# Patient Record
Sex: Female | Born: 1970 | Race: White | Hispanic: Yes | Marital: Single | Smoking: Never smoker
Health system: Southern US, Community
[De-identification: ages and names within clinical notes are randomized; demographics above are authoritative.]

## PROBLEM LIST (undated history)

## (undated) DIAGNOSIS — A159 Respiratory tuberculosis unspecified: Secondary | ICD-10-CM

## (undated) HISTORY — DX: Respiratory tuberculosis unspecified: A15.9

---

## 2021-06-11 ENCOUNTER — Ambulatory Visit (INDEPENDENT_AMBULATORY_CARE_PROVIDER_SITE_OTHER): Payer: Self-pay

## 2021-06-11 ENCOUNTER — Ambulatory Visit
Admission: EM | Admit: 2021-06-11 | Discharge: 2021-06-11 | Disposition: A | Discharge status: Home / Self Care | Payer: Self-pay | Attending: Family Medicine | Admitting: Family Medicine

## 2021-06-11 ENCOUNTER — Other Ambulatory Visit: Payer: Self-pay

## 2021-06-11 DIAGNOSIS — A150 Tuberculosis of lung: Secondary | ICD-10-CM

## 2021-06-11 DIAGNOSIS — R1110 Vomiting, unspecified: Secondary | ICD-10-CM

## 2021-06-11 DIAGNOSIS — R0989 Other specified symptoms and signs involving the circulatory and respiratory systems: Secondary | ICD-10-CM

## 2021-06-11 MED ORDER — ONDANSETRON 4 MG PO TBDP
4.0000 mg | ORAL_TABLET | Freq: Three times a day (TID) | ORAL | 0 refills | Status: AC | PRN
Start: 2021-06-11 — End: ?

## 2021-06-11 NOTE — Discharge Instructions (Signed)
The health department will call for further monitoring and recommendations.  Continue your treatment.  Medication as directed for nausea and vomiting.  Take care  Dr. Adriana Simas

## 2021-06-11 NOTE — ED Provider Notes (Signed)
[facility]    CSN: [id number] Arrival date & time: 06/11/21  1054      History   Chief Complaint Vomiting  HPI  50 year old female presents with the above complaint.  Spanish interpreter used.  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  She is currently on treatment.  She was diagnosed and placed on treatment at the [place] [facility] and [place].  Medications are listed below.  Patient states that she was diagnosed 15 days ago.  The prescription bottles reflect that it was ordered on 8/30 and it was filled on 9/6.  Patient states that she quarantined for 2 weeks.  I am unsure if this is accurate.  She has no records with her other than the fact that she has her prescription medications.  Patient states that she has been having vomiting.  She states that this occurred when she was diagnosed with TB.  She states that she has not thrown up since yesterday.  No reports of cough.  No other respiratory symptoms.  No fever.  Patient is concerned about her diagnosis.  Upon initial triage, she was requesting evaluation of her lungs (referring to x-ray).  She is currently residing with her daughter here in [place].  Home Medications    Prior to Admission medications   Medication Sig Start Date End Date Taking? Authorizing Provider  ethambutol (MYAMBUTOL) 400 MG tablet Take by mouth daily.   Yes [provider]  famotidine (PEPCID) 20 MG tablet Take 20 mg by mouth 2 (two) times daily.   Yes [provider]  isoniazid (NYDRAZID) 300 MG tablet Take by mouth daily.   Yes [provider]  ondansetron (ZOFRAN ODT) 4 MG disintegrating tablet Take 1 tablet (4 mg total) by mouth every 8 (eight) hours as needed for nausea or vomiting. 06/11/21  Yes Everlene Other G, DO  pyrazinamide 500 MG tablet Take by mouth daily.   Yes [provider]  pyridOXINE (B-6) 50 MG tablet Take 50 mg by mouth daily.   Yes [provider]   rifampin (RIFADIN) 300 MG capsule Take by mouth.   Yes [provider]    Family History No family history on file.  Social History     Allergies   Patient has no allergy information on record.   Review of Systems Review of Systems Per HPI  Physical Exam Triage Vital Signs ED Triage Vitals  Enc Vitals Group     BP 06/11/21 1152 125/66     Pulse Rate 06/11/21 1152 89     Resp 06/11/21 1152 16     Temp 06/11/21 1152 98.5 F (36.9 C)     Temp Source 06/11/21 1152 Oral     SpO2 06/11/21 1152 100 %     Weight --      Height --      Head Circumference --      Peak Flow --      Pain Score 06/11/21 1149 0     Pain Loc --      Pain Edu? --      Excl. in GC? --    Updated Vital Signs BP 125/66 (BP Location: Left Arm)   Pulse 89   Temp 98.5 F (36.9 C) (Oral)   Resp 16   SpO2 100%   Visual Acuity Right Eye Distance:   Left Eye Distance:   Bilateral Distance:    Right Eye Near:   Left Eye Near:  Bilateral Near:     Physical Exam Vitals and nursing note reviewed.  Constitutional:      General: She is not in acute distress.    Appearance: Normal appearance. She is not ill-appearing.  HENT:     Head: Normocephalic and atraumatic.  Eyes:     General:        Right eye: No discharge.        Left eye: No discharge.     Conjunctiva/sclera: Conjunctivae normal.  Cardiovascular:     Rate and Rhythm: Normal rate and regular rhythm.  Pulmonary:     Comments: Crackles and coarse breath sounds in the upper lobes. Abdominal:     General: There is no distension.     Palpations: Abdomen is soft.     Tenderness: There is no abdominal tenderness.  Neurological:     Mental Status: She is alert.  Psychiatric:        Mood and Affect: Mood normal.        Behavior: Behavior normal.     UC Treatments / Results  Labs (all labs ordered are listed, but only abnormal results are displayed) Labs Reviewed - No data to display  EKG   Radiology DG Chest 2  View  Result Date: 06/11/2021 CLINICAL DATA:  Abnormal lung sounds. On treatment for tuberculosis. EXAM: CHEST - 2 VIEW COMPARISON:  None. FINDINGS: Two views of the chest demonstrate architectural distortion in the upper lungs, right side greater than left. Evidence for cystic changes or bronchiectasis in the upper lungs. Pleural-based densities along the lung apices, right side greater than left. Questionable 2.0 cm round nodular density near the right lung apex. Heart size is normal. No large pleural effusions. IMPRESSION: Architectural distortion in the upper lungs, right side greater than left. Scattered pleural and parenchymal opacities in the upper lungs that could represent acute on chronic disease, right side greater than left. In addition, there is an indeterminate 2.0 cm nodular opacity in the right upper lung. Recommend continued follow-up or further characterization with chest CT. Electronically Signed   By: Richarda Overlie M.D.   On: 06/11/2021 12:30    Procedures Procedures (including critical care time)  Medications Ordered in UC Medications - No data to display  Initial Impression / Assessment and Plan / UC Course  I have reviewed the triage vital signs and the nursing notes.  Pertinent labs & imaging results that were available during my care of the patient were reviewed by me and considered in my medical decision making (see chart for details).    50 year old female presents with vomiting.  Patient is on treatment for tuberculosis.  X-ray was obtained.  X-ray shows extensive changes and opacities in the upper lobes.  I have contacted the health department regarding her case.  They will be in touch with the patient.  Zofran as needed.  Final Clinical Impressions(s) / UC Diagnoses   Final diagnoses:  Non-intractable vomiting, presence of nausea not specified, unspecified vomiting type  TB (pulmonary tuberculosis)     Discharge Instructions      The health department will call  for further monitoring and recommendations.  Continue your treatment.  Medication as directed for nausea and vomiting.  Take care  Dr. Adriana Simas      ED Prescriptions     Medication Sig Dispense Auth. Provider   ondansetron (ZOFRAN ODT) 4 MG disintegrating tablet Take 1 tablet (4 mg total) by mouth every 8 (eight) hours as needed for nausea or vomiting. 20  tablet Tommie Sams, DO      PDMP not reviewed this encounter.   Tommie Sams, [place] 06/11/21 1325

## 2021-06-11 NOTE — ED Triage Notes (Signed)
Pt states she is here to have her lungs looked at. Pt was DX with TB 15 days ago.

## 2021-06-24 ENCOUNTER — Other Ambulatory Visit: Payer: Self-pay

## 2021-06-24 ENCOUNTER — Ambulatory Visit (LOCAL_COMMUNITY_HEALTH_CENTER): Payer: Self-pay

## 2021-06-24 VITALS — Wt 94.0 lb

## 2021-06-24 DIAGNOSIS — Z0389 Encounter for observation for other suspected diseases and conditions ruled out: Secondary | ICD-10-CM

## 2021-06-24 LAB — HM HIV SCREENING LAB: HM HIV Screening: NEGATIVE

## 2021-06-25 LAB — HGB A1C W/O EAG: Hgb A1c MFr Bld: 5.9 % — ABNORMAL HIGH (ref 4.8–5.6)

## 2021-06-26 LAB — CREATININE, SERUM
Creatinine, Ser: 0.44 mg/dL — ABNORMAL LOW (ref 0.57–1.00)
eGFR: 117 mL/min/{1.73_m2} (ref >59)

## 2021-06-27 LAB — CBC WITH DIFFERENTIAL/PLATELET
Basophils Absolute: 0.1 10*3/uL (ref 0.0–0.2)
Basos: 1 %
EOS (ABSOLUTE): 0.4 10*3/uL (ref 0.0–0.4)
Eos: 5 %
Hematocrit: 39.8 % (ref 34.0–46.6)
Hemoglobin: 12.7 g/dL (ref 11.1–15.9)
Immature Grans (Abs): 0.1 10*3/uL (ref 0.0–0.1)
Immature Granulocytes: 1 %
Lymphocytes Absolute: 1.5 10*3/uL (ref 0.7–3.1)
Lymphs: 21 %
MCH: 26.2 pg — ABNORMAL LOW (ref 26.6–33.0)
MCHC: 31.9 g/dL (ref 31.5–35.7)
MCV: 82 fL (ref 79–97)
Monocytes Absolute: 0.3 10*3/uL (ref 0.1–0.9)
Monocytes: 4 %
Neutrophils Absolute: 5 10*3/uL (ref 1.4–7.0)
Neutrophils: 68 %
Platelets: 664 10*3/uL — ABNORMAL HIGH (ref 150–450)
RBC: 4.84 x10E6/uL (ref 3.77–5.28)
RDW: 16.9 % — ABNORMAL HIGH (ref 11.7–15.4)
WBC: 7.3 10*3/uL (ref 3.4–10.8)

## 2021-06-27 LAB — HEPATIC FUNCTION PANEL
ALT: 96 IU/L — ABNORMAL HIGH (ref 0–32)
AST: 58 IU/L — ABNORMAL HIGH (ref 0–40)
Albumin: 3.5 g/dL — ABNORMAL LOW (ref 3.8–4.9)
Alkaline Phosphatase: 90 IU/L (ref 44–121)
Bilirubin Total: 0.5 mg/dL (ref 0.0–1.2)
Bilirubin, Direct: 0.29 mg/dL (ref 0.00–0.40)
Total Protein: 6.7 g/dL (ref 6.0–8.5)

## 2021-06-27 LAB — QUANTIFERON-TB GOLD PLUS
QuantiFERON Mitogen Value: 0.27 IU/mL
QuantiFERON Nil Value: 0.18 IU/mL
QuantiFERON TB1 Ag Value: 0.54 IU/mL
QuantiFERON TB2 Ag Value: 0.57 IU/mL
QuantiFERON-TB Gold Plus: POSITIVE — AB

## 2021-07-11 ENCOUNTER — Encounter (HOSPITAL_COMMUNITY): Payer: Self-pay | Admitting: Radiology

## 2021-07-11 NOTE — Progress Notes (Signed)
49yo patient that is being evaluated for TB. She fled [place] and entered [place] in August.  ICE detained her and she was placed in a detention center where they did a CXR and sputums.  She states she has vomited everyday since starting RIPE, the end of August and was tearful in the office today. Patient states the medicines are making me sick. IJN forms received but hard to read; included some labs and negative sputum smear. Patient states she had a cough when she arrived in [place] but once she started inhalers the cough subsided.  C/o stomach burning from TBMs.  Has been told previously that she is prediabetes.   [facility] from [place] finally sent culture results today 07/11/21 and she is positive for MTB. We stopped her meds when she was in on 06/24/21 d/t the vomiting.  She denies any sx's of TB and cannot produce sputum.    Her last CXR was 06/11/21 when she was evaluated an urgent care. Richmond Campbell, RN

## 2021-07-12 ENCOUNTER — Other Ambulatory Visit: Payer: Self-pay | Admitting: Family Medicine

## 2021-07-12 ENCOUNTER — Other Ambulatory Visit: Payer: Self-pay

## 2021-07-12 ENCOUNTER — Ambulatory Visit: Payer: Self-pay

## 2021-07-12 VITALS — Wt 94.0 lb

## 2021-07-12 DIAGNOSIS — A159 Respiratory tuberculosis unspecified: Secondary | ICD-10-CM | POA: Insufficient documentation

## 2021-07-12 NOTE — Progress Notes (Signed)
Tuberculosis treatment orders  All patients are to be monitored per [place] and county TB policies.    Kathleen Wade has active/suspect TB. Treat for active TB per the following:  See EPI for patient hx.  Collect sputum when patient able to provide sample.     CBC with platelets, liver function tests, creatinine, and HIV completed on 06/24/21 Draw liver function tests monthly. Order end of treatment chest x-ray. Video DOT approved for this patient.  Serial rechallenge: Week #1: Dose #1 EMB (full dose), RIF 600 mg Dose #2 EMB, RIF 600 mg Dose #3 EMB, RIF 600 mg. Dose #4 EMB, RIF 600 mg Dose #5 EMB, RIF 600 mg  Week #2: Dose #6 EMB, RIF 600 mg, INH 300 mg Dose #7 EMB, RIF 600 mg, INH 300 mg Dose #8 EMB, RIF 600 mg, INH 300 mg Dose #9 EMB, RIF 600 mg, INH 300 mg Dose #10 EMB, RIF 600 mg, INH 300 mg  Week #3: Dose #11 EMB, RIF 600 mg, INH 300 mg, PZA 500 mg Dose #12 EMB, RIF 600 mg, INH 300 mg, PZA 1000mg  Dose #13 EMB, RIF 600 mg, INH 300mg , PZA 1500 mg Dose #14 EMB, RIF 600 mg, INH 300mg , PZA full dose Dose #15 EMB, RIF 600 mg, INH 300mg , PZA full dose   Due to patient vomiting daily with previous RIPE treatment started in , will do a serial rechallenge with TB meds. If patient tolerates well, will transition to traditional tx plan as follows.  For eight weeks:  Isoniazid 300mg  daily by mouth. Rifampin 600mg  daily by mouth. Pyrazinamide 1000mg  daily by mouth. Ethambutol 800mg  daily by mouth. Vitamin B6 25mg  daily by mouth.  THEN for the next 18 weeks:   Isoniazid 900mg  by mouth three times per week. Rifampin 600mg  by mouth three times per week. Vitamin B6 50mg  by mouth three times per week.  Comments:      Discontinue PZA after 8 weeks if organism is fully susceptible to INH and Rifampin.  Discontinue EMB when drug susceptibility testing on the initial positive culture indicates that the organism is fully suscpeptible to INH and Rifampin.   If the patient  has a cavity on initial X-ray and fails to convert two sputum specimens to negative within 60 days of starting treatment (based on the collection date), treatment must be extended for a total of nine months (continuation phase of 31 weeks)

## 2021-07-12 NOTE — Progress Notes (Addendum)
Patient in clinic today after we received +MTB results from [place] yesterday.  Will start TB meds today and do a serial rechallenge per Dr. Valentina Lucks, recommendation, because the patient vomited daily with TB meds previously. In with daughter today.  States would like to do DOT daily ~2pm and will use her TB med supply from [place] and then transition to [facility] pharmacy supply.  Patient c/o breast pain but not sure if its from TB or breast. Has a new pt appt with [facility] on 08/01/21; encourage patient and daughter to inform PCP of pain.  [facility] will arrange new pt exam with Dr. Alvester Morin in the coming weeks.   Patient will RTC on 07/17/21 for next phase of med regimen and then RTC once again on 07/22/21. Patient given TB RNs contact #s for weekends and evenings if needed.   Patient denies TBM side effects.  TBM administered via DOT per Dr. Karyl Kinnier orders. Patient tolerated well.  Observed patient taking EMB 800mg  and Rifampin 600mg . Advised patient to call [facility] to report any signs and symptoms ASAP and to hold TB medication if symptoms occur.  No sputums or labs drawn today. , RN    Interpreter V. Olmedo

## 2021-07-15 ENCOUNTER — Ambulatory Visit: Payer: Self-pay

## 2021-07-15 DIAGNOSIS — A159 Respiratory tuberculosis unspecified: Secondary | ICD-10-CM

## 2021-07-15 NOTE — Progress Notes (Signed)
Patient denies TBM side effects.  States medications did not bother her stomach this weekend, no nausea or vomiting.  TBM administered via DOT per Dr. Karyl Kinnier orders. Patient tolerated well.  Observed patient taking EMB 800mg  and Rifampin 600mg . Advised patient to call [facility] to report any signs and symptoms ASAP and to hold TB medication if symptoms occur.  No sputums or labs drawn today. , RN

## 2021-07-16 ENCOUNTER — Ambulatory Visit: Payer: Self-pay

## 2021-07-16 DIAGNOSIS — A159 Respiratory tuberculosis unspecified: Secondary | ICD-10-CM

## 2021-07-17 ENCOUNTER — Ambulatory Visit (LOCAL_COMMUNITY_HEALTH_CENTER): Payer: Self-pay

## 2021-07-17 VITALS — Wt 94.0 lb

## 2021-07-17 DIAGNOSIS — A159 Respiratory tuberculosis unspecified: Secondary | ICD-10-CM

## 2021-07-17 NOTE — Progress Notes (Signed)
Patient denies any new TBM side effects. States she continues with "a little burning" in the stomach, with a 1-2 on the pain scale.  States her pain was an 8 on the pain scale when she previously took the medications.  States she doesn't take her Famotidine (Pepcid) as prescribed, only takes one every once and a while.  Encouraged to take as prescribed.   Will begin INH today. No numbness/tingling in extremities only slight pain in fingers yesterday morning, 1 on the pain scale and resolved in a few hours.    Reviewed "reintroducing TB Meds DOT schedule" with patient and daughter.  Today is Week #2 and Dose #6 of Reintroducing Daily TB Medications.  Will continue with EMB 800mg /Rifampin 600mg  and will add INH 300mg  today as per Dr. order.   TBM administered via DOT per Dr. orders. Patient tolerated well.  Observed patient taking EMB 800mg , Rifampin 600mg  and INH 300mg .     LFT and CBC labs drawn today.  Patient will return for office visit - repeat labs and Week #3 medications Monday 07/22/2021 at 3:00pm.  Video DOT will continue at 2pm weekdays.  Self administer weekends.  Educated regarding side effects of TBM.   Advised patient to call [facility] to report any signs and symptoms ASAP and to hold TB medication if symptoms occur.   TB Treatment Agreement read by interpreter Karyl Kinnier, patient and daughter verbalizes understanding, patient signed agreement.  Copy given to patient.   , RN

## 2021-07-17 NOTE — Progress Notes (Signed)
Late Entry: Patient denies TBM side effects. TBM administered via DOT per Dr. Karyl Kinnier orders. Patient tolerated well.  Observed patient taking EMB 800mg  and Rifampin 600mg . Advised patient to call [facility] to report any signs and symptoms ASAP and to hold TB medication if symptoms occur.  No sputums or labs drawn today. , RN

## 2021-07-17 NOTE — Progress Notes (Deleted)
Patient denies TBM side effects. TBM administered via DOT per Dr. Karyl Kinnier orders. Patient tolerated well.  Observed patient taking EMB 800mg  and Rifampin 600mg . Advised patient to call [facility] to report any signs and symptoms ASAP and to hold TB medication if symptoms occur.  No sputums or labs drawn today. , RN

## 2021-07-18 ENCOUNTER — Ambulatory Visit: Payer: Self-pay

## 2021-07-18 DIAGNOSIS — A159 Respiratory tuberculosis unspecified: Secondary | ICD-10-CM

## 2021-07-18 LAB — CBC WITH DIFFERENTIAL/PLATELET
Basophils Absolute: 0.1 10*3/uL (ref 0.0–0.2)
Basos: 1 %
EOS (ABSOLUTE): 0.6 10*3/uL — ABNORMAL HIGH (ref 0.0–0.4)
Eos: 9 %
Hematocrit: 39.9 % (ref 34.0–46.6)
Hemoglobin: 13 g/dL (ref 11.1–15.9)
Immature Grans (Abs): 0 10*3/uL (ref 0.0–0.1)
Immature Granulocytes: 1 %
Lymphocytes Absolute: 1.8 10*3/uL (ref 0.7–3.1)
Lymphs: 28 %
MCH: 26.8 pg (ref 26.6–33.0)
MCHC: 32.6 g/dL (ref 31.5–35.7)
MCV: 82 fL (ref 79–97)
Monocytes Absolute: 0.4 10*3/uL (ref 0.1–0.9)
Monocytes: 5 %
Neutrophils Absolute: 3.6 10*3/uL (ref 1.4–7.0)
Neutrophils: 56 %
Platelets: 437 10*3/uL (ref 150–450)
RBC: 4.85 x10E6/uL (ref 3.77–5.28)
RDW: 17.5 % — ABNORMAL HIGH (ref 11.7–15.4)
WBC: 6.5 10*3/uL (ref 3.4–10.8)

## 2021-07-18 LAB — HEPATIC FUNCTION PANEL
ALT: 13 IU/L (ref 0–32)
AST: 29 IU/L (ref 0–40)
Albumin: 4.3 g/dL (ref 3.8–4.9)
Alkaline Phosphatase: 93 IU/L (ref 44–121)
Bilirubin Total: 0.3 mg/dL (ref 0.0–1.2)
Bilirubin, Direct: 0.15 mg/dL (ref 0.00–0.40)
Total Protein: 8 g/dL (ref 6.0–8.5)

## 2021-07-18 NOTE — Progress Notes (Signed)
Patient denies TBM side effects. States medications didn't hurt her stomach.  TBM administered via DOT per Dr. Karyl Kinnier orders. Patient tolerated well.  Observed patient taking EMB 800mg , Rifampin 600mg  and IHN 300mg . Advised patient to call [facility] to report any signs and symptoms ASAP and to hold TB medication if symptoms occur.  No sputums or labs drawn today. , RN

## 2021-07-19 ENCOUNTER — Ambulatory Visit: Payer: Self-pay

## 2021-07-19 DIAGNOSIS — A159 Respiratory tuberculosis unspecified: Secondary | ICD-10-CM

## 2021-07-19 NOTE — Progress Notes (Signed)
Patient denies TBM side effects. States medications didn't hurt her stomach.  TBM administered via DOT per Dr. Karyl Kinnier orders. Patient tolerated well.  Observed patient taking EMB 800mg , Rifampin 600mg  and IHN 300mg . Advised patient to call [facility] to report any signs and symptoms ASAP and to hold TB medication if symptoms occur.  No sputums or labs drawn today.  Patient to return to clinic Monday 10/24.  , RN

## 2021-07-22 ENCOUNTER — Ambulatory Visit (LOCAL_COMMUNITY_HEALTH_CENTER): Payer: Self-pay

## 2021-07-22 DIAGNOSIS — A159 Respiratory tuberculosis unspecified: Secondary | ICD-10-CM

## 2021-07-22 NOTE — Progress Notes (Signed)
Phone call from patient, Saturday 07/20/2021, states didn't take TB medications today due to vomiting after breakfast and abdominal pain.  Pain rates between 5-8.  No other symptoms at this time.  Per consult with Dr. Lyndel Safe, TB Medications held Saturday 07/20/2021 due to vomiting.  May restart EMB and RIF on Sunday 07/21/2021, will continue to hold INH.  Draw LFT's Monday.

## 2021-07-23 ENCOUNTER — Ambulatory Visit (LOCAL_COMMUNITY_HEALTH_CENTER): Payer: Self-pay

## 2021-07-23 ENCOUNTER — Other Ambulatory Visit: Payer: Self-pay

## 2021-07-23 VITALS — Wt 90.0 lb

## 2021-07-23 DIAGNOSIS — R17 Unspecified jaundice: Secondary | ICD-10-CM

## 2021-07-23 DIAGNOSIS — A159 Respiratory tuberculosis unspecified: Secondary | ICD-10-CM

## 2021-07-23 DIAGNOSIS — R1011 Right upper quadrant pain: Secondary | ICD-10-CM

## 2021-07-23 NOTE — Progress Notes (Signed)
Attestation of Medical Director for TB RN: I agree with the care provided to this patient and was available for any consultation.  I was consulted and documentation reflects my recommendations.  ° °Sybel Standish [place] Tashiana Lamarca, MD, MPH, ABFM °[facility] Medical Director ° °

## 2021-07-23 NOTE — Progress Notes (Signed)
Friday vomited all day, Saturday vomited all day,  Sunday vomited all day long and yesterday vomited in the AM x 1.  Still with abdominal pain. Denies diarrhea, fever, headache.  Last day she took TBMs were Saturday.  Patient's grandchildren have had vomiting and nasal congestion also throughout the weekend.

## 2021-07-23 NOTE — Progress Notes (Signed)
Medical Director EXAM for TB program  Subjective: here today for TB medication. Had nausea/vomitting starting 10/21. She restarted INH on 10/19. She also reported abdominal pain that started 10/22.   Objective: Physical Exam Nursing note reviewed. Exam conducted with a chaperone present.  Constitutional:      Appearance: Normal appearance.     Comments: Thin patient, prominent cheek and collar bones.    HENT:     Head: Normocephalic and atraumatic.     Nose: Nose normal. No congestion.     Mouth/Throat:     Mouth: Mucous membranes are dry.     Pharynx: Oropharynx is clear.  Eyes:     General: No scleral icterus.    Extraocular Movements: Extraocular movements intact.     Pupils: Pupils are equal, round, and reactive to light.  Cardiovascular:     Rate and Rhythm: Normal rate.     Pulses: Normal pulses.  Pulmonary:     Effort: Pulmonary effort is normal. No respiratory distress.     Breath sounds: No stridor. Wheezing (faint throughout upper lung fields) present.  Abdominal:     General: Abdomen is flat. Bowel sounds are normal.     Palpations: Abdomen is soft. There is no mass.     Tenderness: There is abdominal tenderness (TTP over RUQ over liver. tender on percussion of liver. Mild epigastric TTP. Not tender in other areas. Patient winces with palpation of liver). There is guarding. There is no rebound.     Hernia: No hernia is present.  Musculoskeletal:        General: No swelling or tenderness. Normal range of motion.     Cervical back: Normal range of motion.  Skin:    General: Skin is warm.     Coloration: Skin is jaundiced.     Findings: Bruising: mild bronzing which is change from baseline.  Neurological:     General: No focal deficit present.     Mental Status: She is alert and oriented to person, place, and time.  Psychiatric:        Mood and Affect: Mood normal.    Assessment/Plan:  Kathleen Wade is 51 y.o. undergoing active TB treatment with prior  identified adverse SE that was on gradual TB medication restart. She was tolerating RIF and EMB with proven normal LFT on 10/19. She was restarted on INH and 2-3 days later developed N/V and abdominal pain. Based on my exam I do think this is an intolerance to INH.  - Restart RIF and EMB - Start PZA today - Order Moxifloxacin plan to start once arrived   Federico Flake, MD, MPH, ABFM Medical Director  [facility] Department

## 2021-07-24 LAB — COMPREHENSIVE METABOLIC PANEL
ALT: 350 IU/L — ABNORMAL HIGH (ref 0–32)
AST: 791 IU/L (ref 0–40)
Albumin/Globulin Ratio: 1.3 (ref 1.2–2.2)
Albumin: 4.6 g/dL (ref 3.8–4.9)
Alkaline Phosphatase: 106 IU/L (ref 44–121)
BUN/Creatinine Ratio: 13 (ref 9–23)
BUN: 10 mg/dL (ref 6–24)
Bilirubin Total: 0.4 mg/dL (ref 0.0–1.2)
CO2: 23 mmol/L (ref 20–29)
Calcium: 9.6 mg/dL (ref 8.7–10.2)
Chloride: 100 mmol/L (ref 96–106)
Creatinine, Ser: 0.79 mg/dL (ref 0.57–1.00)
Globulin, Total: 3.6 g/dL (ref 1.5–4.5)
Glucose: 83 mg/dL (ref 70–99)
Potassium: 5.1 mmol/L (ref 3.5–5.2)
Sodium: 139 mmol/L (ref 134–144)
Total Protein: 8.2 g/dL (ref 6.0–8.5)
eGFR: 90 mL/min/{1.73_m2} (ref >59)

## 2021-07-24 LAB — CBC
Hematocrit: 41.1 % (ref 34.0–46.6)
Hemoglobin: 14 g/dL (ref 11.1–15.9)
MCH: 27.3 pg (ref 26.6–33.0)
MCHC: 34.1 g/dL (ref 31.5–35.7)
MCV: 80 fL (ref 79–97)
Platelets: 438 10*3/uL (ref 150–450)
RBC: 5.12 x10E6/uL (ref 3.77–5.28)
RDW: 17.3 % — ABNORMAL HIGH (ref 11.7–15.4)
WBC: 6.3 10*3/uL (ref 3.4–10.8)

## 2021-07-25 ENCOUNTER — Telehealth: Payer: Self-pay

## 2021-07-25 NOTE — Progress Notes (Signed)
No visit or DOT today.  Patient was supposed to arrive after grandchildren's sick visit but couldn't make it in to clinic today for labs and follow up.  Patient will come into clinic 07/23/21 for evaluation. Richmond Campbell, RN

## 2021-07-25 NOTE — Progress Notes (Signed)
Patient was given 5 packs of TB meds (Rifampin 600mg , PZA 1000mg , EMB 800mg  and B6 25mg ) to take home.  [facility] has ordered Moxi to replace INH in therapy.  Will add after trial of PZA. Reported to TB RN that she had not eaten lunch and would like to eat before taking meds.  Patient instructed to call TB RN after eating to take meds but never called to do video DOT.  Patient reported next day that she took meds on her own. , RN

## 2021-07-25 NOTE — Telephone Encounter (Signed)
TC with patient and daughter.  Informed of abnormal labs.  Instructed patient to stop TB meds x 1 week per Dr. Alvester Morin. [facility] will recheck liver function next Wednesday, 07/31/21 and reassess. Patient instructed to bring all meds in when she comes to clinic next week. Reports has had no vomiting since her visit on Monday and is feeling somewhat better. Stated after taking her TB meds on Monday 07/22/21 she felt like vomiting afterwards but didn't. Instructed patient and daughter to call TB RN with any concerns or changes. Richmond Campbell, RN   Interpreter M. [place]

## 2021-07-26 ENCOUNTER — Telehealth: Payer: Self-pay

## 2021-07-26 NOTE — Telephone Encounter (Signed)
TC with patient and daughter.  States she is feeling better.  Patient originally scheduled to come in on Wednesday but needs to come in on Tuesday.  Dr. Alvester Morin is not in the office on Wednesday.  Patient's daughter will bring her 2 children also for skin testing (both [place] born). Patient and daughter to call TB RN over weekend if and concerns Richmond Campbell, RN

## 2021-07-30 ENCOUNTER — Telehealth: Payer: Self-pay

## 2021-07-30 NOTE — Telephone Encounter (Signed)
TC from patient's daughter.  States she and her mom cannot come in today due to a legal emergency.  States she can come in Thursday 08/01/21.    Sent message to daughter asking if 3:45 is ok for appt time that way the grandchildren and herself can be tested also at the same time. Awaiting answer. Richmond Campbell, RN

## 2021-07-30 NOTE — Telephone Encounter (Signed)
Patient's daughter confirmed, 3:45 is good for 08/01/21 Richmond Campbell, RN

## 2021-08-01 ENCOUNTER — Ambulatory Visit (LOCAL_COMMUNITY_HEALTH_CENTER): Payer: Self-pay

## 2021-08-01 DIAGNOSIS — A159 Respiratory tuberculosis unspecified: Secondary | ICD-10-CM

## 2021-08-02 LAB — CBC WITH DIFFERENTIAL/PLATELET
Basophils Absolute: 0.1 10*3/uL (ref 0.0–0.2)
Basos: 1 %
EOS (ABSOLUTE): 0.6 10*3/uL — ABNORMAL HIGH (ref 0.0–0.4)
Eos: 7 %
Hematocrit: 38.1 % (ref 34.0–46.6)
Hemoglobin: 12.1 g/dL (ref 11.1–15.9)
Immature Grans (Abs): 0 10*3/uL (ref 0.0–0.1)
Immature Granulocytes: 0 %
Lymphocytes Absolute: 2.4 10*3/uL (ref 0.7–3.1)
Lymphs: 29 %
MCH: 26.4 pg — ABNORMAL LOW (ref 26.6–33.0)
MCHC: 31.8 g/dL (ref 31.5–35.7)
MCV: 83 fL (ref 79–97)
Monocytes Absolute: 0.4 10*3/uL (ref 0.1–0.9)
Monocytes: 5 %
Neutrophils Absolute: 4.8 10*3/uL (ref 1.4–7.0)
Neutrophils: 58 %
Platelets: 350 10*3/uL (ref 150–450)
RBC: 4.58 x10E6/uL (ref 3.77–5.28)
RDW: 16.9 % — ABNORMAL HIGH (ref 11.7–15.4)
WBC: 8.3 10*3/uL (ref 3.4–10.8)

## 2021-08-02 LAB — HEPATIC FUNCTION PANEL
ALT: 56 IU/L — ABNORMAL HIGH (ref 0–32)
AST: 40 IU/L (ref 0–40)
Albumin: 4.3 g/dL (ref 3.8–4.9)
Alkaline Phosphatase: 78 IU/L (ref 44–121)
Bilirubin Total: 0.3 mg/dL (ref 0.0–1.2)
Bilirubin, Direct: 0.12 mg/dL (ref 0.00–0.40)
Total Protein: 7 g/dL (ref 6.0–8.5)

## 2021-08-02 NOTE — Progress Notes (Deleted)
No charge QFT- contact to TB Richmond Campbell, RN

## 2021-08-02 NOTE — Progress Notes (Signed)
Patient in today for follow up LFTs. Patient still not taking any TB meds until we have results. Richmond Campbell, RN

## 2021-08-05 ENCOUNTER — Ambulatory Visit: Payer: Self-pay

## 2021-08-05 DIAGNOSIS — A159 Respiratory tuberculosis unspecified: Secondary | ICD-10-CM

## 2021-08-05 NOTE — Progress Notes (Signed)
Patient informed of improving LFTs.  TBM administered via DOT at patient's residence per Dr. Karyl Kinnier orders. Patient very hesitant taking medication. Patient tolerated well.   Observed patient taking Rifampin 600mg ,  Ethambutol 800mg  and Pyrazinamide 1000mg .  Advised patient to call [facility] to report any signs and symptoms ASAP and to hold TB medication if symptoms occur.  No sputums or labs drawn today.  Dispensed #7 days of TB meds to patient.  Per [facility] instructions patient will take Rifampin, PZA and EMB x 3 days.  Then if patient is tolerating meds well, will add in Moxifloxacin 400mg  on 08/08/21.  TB RN numbered the bags (1-7) of TB meds so patient will take in order. Instructions given to patient and her daughter. , RN

## 2021-08-05 NOTE — Progress Notes (Signed)
INH discontinued due to INH toxicity. Patient to restart TB meds 08/05/21. Rifampin 600mg , PZA 1000mg  and EMB 800mg  daily x 3 days. On day 4 (08/08/21) will add Moxifloxacin 400mg  daily x 4 days. Patient needs follow up LFTs 1 week after taking all medications.  If tolerating TB meds well, will continue this regimen x 8 weeks.   Once susceptibilities are received from state of , patient will continue Rifampin/Moxifloxacin x 18 weeks.

## 2021-08-06 ENCOUNTER — Ambulatory Visit: Payer: Self-pay

## 2021-08-06 DIAGNOSIS — A159 Respiratory tuberculosis unspecified: Secondary | ICD-10-CM

## 2021-08-07 ENCOUNTER — Ambulatory Visit: Payer: Self-pay

## 2021-08-07 DIAGNOSIS — A159 Respiratory tuberculosis unspecified: Secondary | ICD-10-CM

## 2021-08-08 ENCOUNTER — Ambulatory Visit: Payer: Self-pay

## 2021-08-08 DIAGNOSIS — A159 Respiratory tuberculosis unspecified: Secondary | ICD-10-CM

## 2021-08-08 NOTE — Progress Notes (Signed)
Patient informed of improving LFTs.  TBM administered via DOT at patient's residence per Dr. Karyl Kinnier orders. Patient very hesitant taking medication. Patient tolerated well.   Observed patient taking Rifampin 600mg ,  Ethambutol 800mg  and Pyrazinamide 1000mg .  Advised patient to call [facility] to report any signs and symptoms ASAP and to hold TB medication if symptoms occur.  No sputums or labs drawn today. , RN

## 2021-08-09 NOTE — Progress Notes (Signed)
#  2 day of restarting meds.  Patient with no complaints. TBM administered via video DOT per Dr. Karyl Kinnier orders. Patient very hesitant taking medication. Patient tolerated well.   Observed patient taking Rifampin 600mg ,  Ethambutol 800mg  and Pyrazinamide 1000mg .  Advised patient to call [facility] to report any signs and symptoms ASAP and to hold TB medication if symptoms occur.  No sputums or labs drawn today. , RN

## 2021-08-09 NOTE — Progress Notes (Signed)
TBM administered via DOT at patient's residence per Dr. Karyl Kinnier orders. Patient very hesitant taking medication. Patient tolerated well.   Observed patient taking Rifampin 600mg , Moxifloxacin 400mg , Ethambutol 800mg  and Pyrazinamide 1000mg .  Advised patient to call [facility] to report any signs and symptoms ASAP and to hold TB medication if symptoms occur.  No sputums or labs drawn today. Tomorrow 08/09/21 is a holiday. Patient will send video of self admin to TB RN so dose can be counted.  Today was first day of Moxifloxacin.  Patient and daughter voiced understanding to call TB RN over the weekend if any new side effects or symptoms and not take meds. Patient will return to clinic on Monday for more meds and LFTs. , RN

## 2021-08-12 ENCOUNTER — Ambulatory Visit (LOCAL_COMMUNITY_HEALTH_CENTER): Payer: Self-pay

## 2021-08-12 ENCOUNTER — Other Ambulatory Visit: Payer: Self-pay

## 2021-08-12 VITALS — Wt 93.0 lb

## 2021-08-12 DIAGNOSIS — A159 Respiratory tuberculosis unspecified: Secondary | ICD-10-CM

## 2021-08-13 ENCOUNTER — Ambulatory Visit: Payer: Self-pay

## 2021-08-13 DIAGNOSIS — A159 Respiratory tuberculosis unspecified: Secondary | ICD-10-CM

## 2021-08-13 LAB — HEPATIC FUNCTION PANEL
ALT: 20 IU/L (ref 0–32)
AST: 36 IU/L (ref 0–40)
Albumin: 4.4 g/dL (ref 3.8–4.9)
Alkaline Phosphatase: 81 IU/L (ref 44–121)
Bilirubin Total: 0.2 mg/dL (ref 0.0–1.2)
Bilirubin, Direct: <0.1 mg/dL (ref 0.00–0.40)
Total Protein: 7.7 g/dL (ref 6.0–8.5)

## 2021-08-13 MED ORDER — MOXIFLOXACIN HCL 400 MG PO TABS: 400.0000 mg | ORAL_TABLET | Freq: Every day | ORAL | 0 refills | Status: AC

## 2021-08-13 MED ORDER — RIFAMPIN 300 MG PO CAPS: 600.0000 mg | ORAL_CAPSULE | Freq: Every day | ORAL | 0 refills | Status: AC

## 2021-08-13 MED ORDER — PYRAZINAMIDE 500 MG PO TABS: 1000.0000 mg | ORAL_TABLET | Freq: Every day | ORAL | 0 refills | Status: AC

## 2021-08-13 MED ORDER — ETHAMBUTOL HCL 400 MG PO TABS: 800.0000 mg | ORAL_TABLET | Freq: Every day | ORAL | 0 refills | Status: AC

## 2021-08-13 NOTE — Progress Notes (Signed)
Patient in clinic today for more meds and recheck LFTs. Reports she has done well with restart and start of Moxi. Had vomiting one night but thinks it was the food that she ate. Induced sputum today; patient did have some difficulty. Dispensed #14 packs of TB meds to patient. TB RN will call patient later this afternoon for DOT after she has eaten.    TBM administered via video DOT per Dr. Karyl Kinnier orders. Patient tolerated well. Observed patient taking Moxifloxacin 400mg , Rifampin 600mg , Ethambutol 800mg  and Pyrazinamide 1000mg .  Advised patient to call [facility] to report any signs and symptoms ASAP and to hold TB medication if symptoms occur. , RN   Meds dispensed today were a combo of patient's meds from and [facility] pharmacy meds.

## 2021-08-14 ENCOUNTER — Ambulatory Visit: Payer: Self-pay

## 2021-08-14 DIAGNOSIS — A159 Respiratory tuberculosis unspecified: Secondary | ICD-10-CM

## 2021-08-14 NOTE — Progress Notes (Signed)
TBM administered via video DOT per Dr. Karyl Kinnier orders. Patient tolerated well.   Observed patient taking Moxifloxacin 400mg , Rifampin 600mg , Ethambutol 800mg  and Pyrazinamide 1000mg . Advised patient to call [facility] to report any signs and symptoms ASAP and to hold TB medication if symptoms occur.  No sputums or labs collected today.  Patient is habitually late for video DOTs.  Sent message to her daughter about changing patient's time for her meds.  She wrote back that she couldn't talk right now. , RN

## 2021-08-15 ENCOUNTER — Ambulatory Visit: Payer: Self-pay

## 2021-08-15 DIAGNOSIS — A159 Respiratory tuberculosis unspecified: Secondary | ICD-10-CM

## 2021-08-16 ENCOUNTER — Ambulatory Visit: Payer: Self-pay

## 2021-08-16 DIAGNOSIS — A159 Respiratory tuberculosis unspecified: Secondary | ICD-10-CM

## 2021-08-16 NOTE — Progress Notes (Signed)
TBM administered via video DOT per Dr. Karyl Kinnier orders. Patient tolerated well.   Observed patient taking Moxifloxacin 400mg , Rifampin 600mg , Ethambutol 800mg  and Pyrazinamide 1000mg . Advised patient to call [facility] to report any signs and symptoms ASAP and to hold TB medication if symptoms occur.  No sputums or labs collected today. , RN

## 2021-08-16 NOTE — Progress Notes (Signed)
TBM administered via video DOT per Dr. Kim Newton orders. Patient tolerated well.   Observed patient taking Moxifloxacin 400mg, Rifampin 600mg, Ethambutol 800mg and Pyrazinamide 1000mg. Advised patient to call [facility] to report any signs and symptoms ASAP and to hold TB medication if symptoms occur.  No sputums or labs collected today.Kourtlyn Charlet, RN   

## 2021-08-19 ENCOUNTER — Ambulatory Visit: Payer: Self-pay

## 2021-08-19 DIAGNOSIS — A159 Respiratory tuberculosis unspecified: Secondary | ICD-10-CM

## 2021-08-19 NOTE — Progress Notes (Signed)
TBM administered via video DOT per Dr. Karyl Kinnier orders. Patient tolerated well.   Observed patient taking Moxifloxacin 400mg , Rifampin 600mg , Ethambutol 800mg  and Pyrazinamide 1000mg . Advised patient to call [facility] to report any signs and symptoms ASAP and to hold TB medication if symptoms occur.  No sputums or labs collected today. , RN

## 2021-08-20 ENCOUNTER — Ambulatory Visit: Payer: Self-pay

## 2021-08-20 DIAGNOSIS — A159 Respiratory tuberculosis unspecified: Secondary | ICD-10-CM

## 2021-08-21 ENCOUNTER — Ambulatory Visit: Payer: Self-pay

## 2021-08-21 DIAGNOSIS — A159 Respiratory tuberculosis unspecified: Secondary | ICD-10-CM

## 2021-08-21 NOTE — Progress Notes (Signed)
TBM administered via video DOT per Dr. Kim Newton orders. Patient tolerated well.   Observed patient taking Moxifloxacin 400mg, Rifampin 600mg, Ethambutol 800mg and Pyrazinamide 1000mg. Advised patient to call [facility] to report any signs and symptoms ASAP and to hold TB medication if symptoms occur.  No sputums or labs collected today.Kathleen Trahan, RN   

## 2021-08-22 ENCOUNTER — Ambulatory Visit: Payer: Self-pay

## 2021-08-22 DIAGNOSIS — A159 Respiratory tuberculosis unspecified: Secondary | ICD-10-CM

## 2021-08-23 ENCOUNTER — Ambulatory Visit: Payer: Self-pay

## 2021-08-23 DIAGNOSIS — A159 Respiratory tuberculosis unspecified: Secondary | ICD-10-CM

## 2021-08-26 ENCOUNTER — Other Ambulatory Visit: Payer: Self-pay

## 2021-08-26 ENCOUNTER — Ambulatory Visit (LOCAL_COMMUNITY_HEALTH_CENTER): Payer: Self-pay

## 2021-08-26 VITALS — Wt 97.0 lb

## 2021-08-26 DIAGNOSIS — A159 Respiratory tuberculosis unspecified: Secondary | ICD-10-CM

## 2021-08-26 NOTE — Progress Notes (Signed)
Patient in today for repeat LFTs and more meds.  Explained if LFTs are still normal we will not need to recheck again until the end of December.  Patient reports doing well on TB meds with no vomiting or other issues. Dispensed #31 packs of TBMs today with PZA, Rifampin and Moxi. Last day of EMB was 08/25/2021. Susceptibilities received. TBM administered via DOT per Dr. Karyl Kinnier orders. Patient tolerated well. Observed patient taking Moxifloxacin 400mg , Rifampin 600mg  and Pyrazinamide 1000mg . Advised patient to call [facility] to report any signs and symptoms ASAP and to hold TB medication if symptoms occur. , RN

## 2021-08-27 ENCOUNTER — Ambulatory Visit: Payer: Self-pay

## 2021-08-27 DIAGNOSIS — A159 Respiratory tuberculosis unspecified: Secondary | ICD-10-CM

## 2021-08-27 LAB — HEPATIC FUNCTION PANEL
ALT: 11 IU/L (ref 0–32)
AST: 27 IU/L (ref 0–40)
Albumin: 4.5 g/dL (ref 3.8–4.9)
Alkaline Phosphatase: 77 IU/L (ref 44–121)
Bilirubin Total: <0.2 mg/dL (ref 0.0–1.2)
Bilirubin, Direct: <0.1 mg/dL (ref 0.00–0.40)
Total Protein: 7.6 g/dL (ref 6.0–8.5)

## 2021-08-28 ENCOUNTER — Ambulatory Visit: Payer: Self-pay

## 2021-08-28 DIAGNOSIS — A159 Respiratory tuberculosis unspecified: Secondary | ICD-10-CM

## 2021-08-28 NOTE — Progress Notes (Addendum)
TBM administered via video DOT per Dr. Karyl Kinnier orders. Patient tolerated well.   Observed patient taking Moxifloxacin 400mg , Rifampin 600mg  and Pyrazinamide 1000mg . Advised patient to call [facility] to report any signs and symptoms ASAP and to hold TB medication if symptoms occur.  No sputums or labs collected today. , RN

## 2021-08-28 NOTE — Progress Notes (Addendum)
TBM administered via video DOT per Dr. Karyl Kinnier orders. Patient tolerated well.   Observed patient taking Moxifloxacin 400mg , Rifampin 600mg  and Pyrazinamide 1000mg . Advised patient to call [facility] to report any signs and symptoms ASAP and to hold TB medication if symptoms occur.  No sputums or labs collected today. Informed patient of normal lab results from 08/26/21. , RN

## 2021-08-29 ENCOUNTER — Ambulatory Visit: Payer: Self-pay

## 2021-08-29 DIAGNOSIS — A159 Respiratory tuberculosis unspecified: Secondary | ICD-10-CM

## 2021-08-29 NOTE — Progress Notes (Signed)
TBM administered via video DOT per Dr. Kim Newton orders. Patient tolerated well.   Observed patient taking Moxifloxacin 400mg, Rifampin 600mg and Pyrazinamide 1000mg. Advised patient to call [facility] to report any signs and symptoms ASAP and to hold TB medication if symptoms occur.  No sputums or labs collected today.Kathleen Ohms, RN   

## 2021-08-30 ENCOUNTER — Ambulatory Visit: Payer: Self-pay

## 2021-08-30 DIAGNOSIS — A159 Respiratory tuberculosis unspecified: Secondary | ICD-10-CM

## 2021-08-30 NOTE — Progress Notes (Signed)
TBM administered via video DOT per Dr. Karyl Kinnier orders. Patient tolerated well. Observed patient taking Moxifloxacin 400mg , Rifampin 600mg  and Pyrazinamide 1000mg . Advised patient to call [facility] to report any signs and symptoms ASAP and to hold TB medication if symptoms occur.  No sputums or labs collected today. Patient will send recorded videos of DOT over the thanksgiving holiday so days can be counted. , RN

## 2021-08-31 NOTE — Progress Notes (Signed)
TBM administered via video DOT per Dr. Kim Newton orders. Patient tolerated well.   Observed patient taking Moxifloxacin 400mg, Rifampin 600mg and Pyrazinamide 1000mg. Advised patient to call [facility] to report any signs and symptoms ASAP and to hold TB medication if symptoms occur.  No sputums or labs collected today.Kathleen Wellen, RN   

## 2021-09-02 ENCOUNTER — Ambulatory Visit: Payer: Self-pay

## 2021-09-02 DIAGNOSIS — A159 Respiratory tuberculosis unspecified: Secondary | ICD-10-CM

## 2021-09-02 NOTE — Progress Notes (Signed)
TBM administered via video DOT per Dr. Kim Newton orders. Patient tolerated well.   Observed patient taking Moxifloxacin 400mg, Rifampin 600mg and Pyrazinamide 1000mg. Advised patient to call [facility] to report any signs and symptoms ASAP and to hold TB medication if symptoms occur.  No sputums or labs collected today.Kathleen Viernes, RN   

## 2021-09-03 ENCOUNTER — Ambulatory Visit: Payer: Self-pay

## 2021-09-03 DIAGNOSIS — A159 Respiratory tuberculosis unspecified: Secondary | ICD-10-CM

## 2021-09-03 NOTE — Progress Notes (Signed)
TBM administered via video DOT per Dr. Kim Newton orders. Patient tolerated well.   Observed patient taking Moxifloxacin 400mg, Rifampin 600mg and Pyrazinamide 1000mg. Advised patient to call [facility] to report any signs and symptoms ASAP and to hold TB medication if symptoms occur.  No sputums or labs collected today.Kathleen Cooner, RN   

## 2021-09-04 ENCOUNTER — Ambulatory Visit: Payer: Self-pay

## 2021-09-04 DIAGNOSIS — A159 Respiratory tuberculosis unspecified: Secondary | ICD-10-CM

## 2021-09-04 NOTE — Progress Notes (Signed)
TBM administered via video DOT per Dr. Kim Newton orders. Patient tolerated well.   Observed patient taking Moxifloxacin 400mg, Rifampin 600mg and Pyrazinamide 1000mg. Advised patient to call [facility] to report any signs and symptoms ASAP and to hold TB medication if symptoms occur.  No sputums or labs collected today.Callahan Wild, RN   

## 2021-09-05 ENCOUNTER — Ambulatory Visit: Payer: Self-pay

## 2021-09-05 DIAGNOSIS — A159 Respiratory tuberculosis unspecified: Secondary | ICD-10-CM

## 2021-09-06 ENCOUNTER — Ambulatory Visit: Payer: Self-pay

## 2021-09-06 DIAGNOSIS — A159 Respiratory tuberculosis unspecified: Secondary | ICD-10-CM

## 2021-09-06 NOTE — Progress Notes (Signed)
TBM administered via video DOT per Dr. Kim Newton orders. Patient tolerated well.   Observed patient taking Moxifloxacin 400mg, Rifampin 600mg and Pyrazinamide 1000mg. Advised patient to call [facility] to report any signs and symptoms ASAP and to hold TB medication if symptoms occur.  No sputums or labs collected today.Aarianna Hoadley, RN   

## 2021-09-09 ENCOUNTER — Ambulatory Visit: Payer: Self-pay

## 2021-09-09 DIAGNOSIS — A159 Respiratory tuberculosis unspecified: Secondary | ICD-10-CM

## 2021-09-09 NOTE — Progress Notes (Signed)
TBM administered via video DOT per Dr. Kim Newton orders. Patient tolerated well.   Observed patient taking Moxifloxacin 400mg, Rifampin 600mg and Pyrazinamide 1000mg. Advised patient to call [facility] to report any signs and symptoms ASAP and to hold TB medication if symptoms occur.  No sputums or labs collected today.Moriyah Byington, RN   

## 2021-09-10 ENCOUNTER — Ambulatory Visit: Payer: Self-pay

## 2021-09-10 DIAGNOSIS — A159 Respiratory tuberculosis unspecified: Secondary | ICD-10-CM

## 2021-09-11 ENCOUNTER — Ambulatory Visit: Payer: Self-pay

## 2021-09-11 DIAGNOSIS — A159 Respiratory tuberculosis unspecified: Secondary | ICD-10-CM

## 2021-09-12 ENCOUNTER — Ambulatory Visit: Payer: Self-pay

## 2021-09-12 DIAGNOSIS — A159 Respiratory tuberculosis unspecified: Secondary | ICD-10-CM

## 2021-09-12 NOTE — Progress Notes (Signed)
TBM administered via video DOT per Dr. Kim Newton orders. Patient tolerated well.   Observed patient taking Moxifloxacin 400mg, Rifampin 600mg and Pyrazinamide 1000mg. Advised patient to call [facility] to report any signs and symptoms ASAP and to hold TB medication if symptoms occur.  No sputums or labs collected today.Malorie Bigford, RN   

## 2021-09-13 ENCOUNTER — Ambulatory Visit: Payer: Self-pay

## 2021-09-13 DIAGNOSIS — A159 Respiratory tuberculosis unspecified: Secondary | ICD-10-CM

## 2021-09-13 NOTE — Progress Notes (Signed)
TBM administered via video DOT per Dr. Karyl Kinnier orders. Patient tolerated well. Observed patient taking Moxifloxacin 400mg , Rifampin 600mg  and Pyrazinamide 1000mg . Advised patient to call [facility] to report any signs and symptoms ASAP and to hold TB medication if symptoms occur.  No sputums or labs collected today.   , MD

## 2021-09-15 NOTE — Progress Notes (Signed)
TBM administered via video DOT per Dr. Kim Newton orders. Patient sent video d/t holiday. Patient tolerated well. Observed patient taking Moxifloxacin 400mg, Rifampin 600mg and Pyrazinamide 1000mg. Advised patient to call [facility] to report any signs and symptoms ASAP and to hold TB medication if symptoms occur.  No sputums or labs collected today.Asar Evilsizer, RN  ° °

## 2021-09-15 NOTE — Progress Notes (Signed)
TBM administered via video DOT per Dr. Kim Newton orders. Patient tolerated well.   Observed patient taking Moxifloxacin 400mg, Rifampin 600mg and Pyrazinamide 1000mg. Advised patient to call [facility] to report any signs and symptoms ASAP and to hold TB medication if symptoms occur.  No sputums or labs collected today.Taegen Delker, RN   

## 2021-09-15 NOTE — Progress Notes (Signed)
TBM administered via video DOT per Dr. Kim Newton orders. Patient tolerated well.   Observed patient taking Moxifloxacin 400mg, Rifampin 600mg and Pyrazinamide 1000mg. Advised patient to call [facility] to report any signs and symptoms ASAP and to hold TB medication if symptoms occur.  No sputums or labs collected today.Caroljean Monsivais, RN   

## 2021-09-15 NOTE — Progress Notes (Signed)
TBM administered via video DOT per Dr. Karyl Kinnier orders. Patient sent video d/t holiday. Patient tolerated well. Observed patient taking Moxifloxacin 400mg , Rifampin 600mg  and Pyrazinamide 1000mg . Advised patient to call [facility] to report any signs and symptoms ASAP and to hold TB medication if symptoms occur.  No sputums or labs collected today. , RN

## 2021-09-15 NOTE — Progress Notes (Signed)
TBM administered via video DOT per Dr. Kim Newton orders. Patient tolerated well.   Observed patient taking Moxifloxacin 400mg, Rifampin 600mg and Pyrazinamide 1000mg. Advised patient to call [facility] to report any signs and symptoms ASAP and to hold TB medication if symptoms occur.  No sputums or labs collected today.Kathleen Tonche, RN   

## 2021-09-16 ENCOUNTER — Ambulatory Visit: Payer: Self-pay

## 2021-09-16 DIAGNOSIS — A159 Respiratory tuberculosis unspecified: Secondary | ICD-10-CM

## 2021-09-16 NOTE — Progress Notes (Signed)
TBM administered via video DOT per Dr. Kim Newton orders. Patient tolerated well.   Observed patient taking Moxifloxacin 400mg, Rifampin 600mg and Pyrazinamide 1000mg. Advised patient to call [facility] to report any signs and symptoms ASAP and to hold TB medication if symptoms occur.  No sputums or labs collected today.Kathleen Hebard, RN   

## 2021-09-17 ENCOUNTER — Ambulatory Visit: Payer: Self-pay

## 2021-09-17 DIAGNOSIS — A159 Respiratory tuberculosis unspecified: Secondary | ICD-10-CM

## 2021-09-18 ENCOUNTER — Ambulatory Visit: Payer: Self-pay

## 2021-09-18 DIAGNOSIS — A159 Respiratory tuberculosis unspecified: Secondary | ICD-10-CM

## 2021-09-18 NOTE — Progress Notes (Signed)
TBM administered via video DOT per Dr. Kim Newton orders. Patient tolerated well.   Observed patient taking Moxifloxacin 400mg, Rifampin 600mg and Pyrazinamide 1000mg. Advised patient to call [facility] to report any signs and symptoms ASAP and to hold TB medication if symptoms occur.  No sputums or labs collected today.Madalaine Portier, RN   

## 2021-09-19 ENCOUNTER — Ambulatory Visit: Payer: Self-pay

## 2021-09-19 DIAGNOSIS — A159 Respiratory tuberculosis unspecified: Secondary | ICD-10-CM

## 2021-09-19 NOTE — Progress Notes (Signed)
TBM administered via video DOT per Dr. Kim Newton orders. Patient tolerated well.   Observed patient taking Moxifloxacin 400mg, Rifampin 600mg and Pyrazinamide 1000mg. Advised patient to call [facility] to report any signs and symptoms ASAP and to hold TB medication if symptoms occur.  No sputums or labs collected today.Kathleen Parada, RN   

## 2021-09-25 ENCOUNTER — Ambulatory Visit: Payer: Self-pay

## 2021-09-25 DIAGNOSIS — A159 Respiratory tuberculosis unspecified: Secondary | ICD-10-CM

## 2021-09-26 ENCOUNTER — Ambulatory Visit (LOCAL_COMMUNITY_HEALTH_CENTER): Payer: Self-pay | Admitting: Surgery

## 2021-09-26 ENCOUNTER — Encounter: Payer: Self-pay | Admitting: Surgery

## 2021-09-26 VITALS — Wt 94.0 lb

## 2021-09-26 DIAGNOSIS — A159 Respiratory tuberculosis unspecified: Secondary | ICD-10-CM

## 2021-09-26 MED ORDER — PYRAZINAMIDE 500 MG PO TABS: 1000.0000 mg | ORAL_TABLET | Freq: Every day | ORAL | 0 refills | Status: AC

## 2021-09-26 MED ORDER — MOXIFLOXACIN HCL 400 MG PO TABS: 400.0000 mg | ORAL_TABLET | Freq: Every day | ORAL | 0 refills | Status: AC

## 2021-09-26 MED ORDER — RIFAMPIN 300 MG PO CAPS: 600.0000 mg | ORAL_CAPSULE | Freq: Every day | ORAL | 0 refills | Status: AC

## 2021-09-26 NOTE — Progress Notes (Signed)
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 She has lost weight 97-->94 pounds from last visit, but denies that this is from nausea/vomiting or loss of appetite. Patient was seen by PCP and informed that she was pre-diabetic, which prompted the patient to drastically cut back on carbohydrate intake (rice, pasta, bread) but also meat and chicken protein. Patient was advised that she needs to increase her caloric intake and that proteins are very important for her diet. She expressed an understanding that she does need carbohydrate intake, and should continue to incorporate more meat, chicken protein, grilled foods, vegetables and beans.   Otherwise she does not have any concerning side effects from her current medication regimen, occasional abdominal discomfort and tiredness after taking meds but symptoms soon subside.   Patient will continue med regimen, pyrazinamide done after 4 more doses, then Moxi and Rif daily under DOT. Follow up labs and need to schedule follow up appt for 1 month.  Jennye Moccasin, MD

## 2021-09-27 ENCOUNTER — Ambulatory Visit (LOCAL_COMMUNITY_HEALTH_CENTER): Payer: Self-pay | Admitting: Surgery

## 2021-09-27 DIAGNOSIS — A159 Respiratory tuberculosis unspecified: Secondary | ICD-10-CM

## 2021-09-27 LAB — HEPATIC FUNCTION PANEL
ALT: 13 IU/L (ref 0–32)
AST: 26 IU/L (ref 0–40)
Albumin: 4.4 g/dL (ref 3.8–4.9)
Alkaline Phosphatase: 65 IU/L (ref 44–121)
Bilirubin Total: <0.2 mg/dL (ref 0.0–1.2)
Bilirubin, Direct: <0.1 mg/dL (ref 0.00–0.40)
Total Protein: 7.5 g/dL (ref 6.0–8.5)

## 2021-09-27 NOTE — Progress Notes (Signed)
LFTs are within normal limits, continue current treatment plan and recheck in 1 month.  Jennye Moccasin, MD

## 2021-09-27 NOTE — Progress Notes (Signed)
TBM administered via video DOT per Dr. Kim Newton orders. Patient tolerated well.   Observed patient taking Moxifloxacin 400mg, Rifampin 600mg and Pyrazinamide 1000mg. Advised patient to call [facility] to report any signs and symptoms ASAP and to hold TB medication if symptoms occur.  No sputums or labs collected today.Sicily Zaragoza, RN   

## 2021-09-27 NOTE — Progress Notes (Signed)
TBM administered via video DOT per Dr. Kim Newton orders. Patient tolerated well.   Observed patient taking Moxifloxacin 400mg, Rifampin 600mg and Pyrazinamide 1000mg. Advised patient to call [facility] to report any signs and symptoms ASAP and to hold TB medication if symptoms occur.  No sputums or labs collected today.Jamir Rone, RN   

## 2021-10-01 ENCOUNTER — Ambulatory Visit (LOCAL_COMMUNITY_HEALTH_CENTER): Payer: Self-pay | Admitting: Surgery

## 2021-10-01 DIAGNOSIS — A159 Respiratory tuberculosis unspecified: Secondary | ICD-10-CM

## 2021-10-01 NOTE — Progress Notes (Addendum)
TBM administered via video DOT per Dr. Karyl Kinnier orders. Patient tolerated well. Observed patient taking Moxifloxacin 400 mg (1 tablet) and Rifampin 600 mg (2 capsules). Pyrazinamide is now dc'd. Advised patient to call [facility] to report any signs and symptoms ASAP and to hold TB medication if symptoms occur.  No sputums or labs collected today.   Patient also observed by video over holidays 12/29-09/29/2021 for DOT. Moxifloxacin 400 mg (1 tablet), Rifampin 600 mg (2 capsules) and Pyrazinamide 1000 mg (2 tablets).  Patient observed by video over holiday on 09/30/2021 for DOT. Moxifloxacin 400 mg (1 tablet) and Rifampin 600 mg (2 capsules), NO more Pyrazinamide .  Of note, most recent LFTs are within normal limits, continue current treatment plan and recheck in 1 month.  Jennye Moccasin, MD

## 2021-10-02 ENCOUNTER — Ambulatory Visit (LOCAL_COMMUNITY_HEALTH_CENTER): Payer: Self-pay | Admitting: Surgery

## 2021-10-02 DIAGNOSIS — A159 Respiratory tuberculosis unspecified: Secondary | ICD-10-CM

## 2021-10-03 ENCOUNTER — Ambulatory Visit (LOCAL_COMMUNITY_HEALTH_CENTER): Payer: Self-pay | Admitting: Surgery

## 2021-10-03 DIAGNOSIS — A159 Respiratory tuberculosis unspecified: Secondary | ICD-10-CM

## 2021-10-03 NOTE — Progress Notes (Signed)
TBM administered via video DOT per Dr. Karyl Kinnier orders. Patient tolerated well. Observed patient taking Moxifloxacin 400 mg (1 tablet), Rifampin 600 mg (2 capsules). Advised patient to call [facility] to report any signs and symptoms ASAP and to hold TB medication if symptoms occur.  No sputums or labs collected today.   Jennye Moccasin, MD

## 2021-10-03 NOTE — Progress Notes (Signed)
TBM administered via video DOT per Dr. Karyl Kinnier orders. Patient tolerated well. Observed patient taking Moxifloxacin 400 mg (1 tablet) and Rifampin 600 mg (2 capsules). Advised patient to call [facility] to report any signs and symptoms ASAP and to hold TB medication if symptoms occur.  No sputums or labs collected today.   Of note, this is a late entry by provider but did see patient as above taking medication on 10/02/2021.  Jennye Moccasin, MD

## 2021-10-04 ENCOUNTER — Ambulatory Visit (LOCAL_COMMUNITY_HEALTH_CENTER): Payer: Self-pay | Admitting: Surgery

## 2021-10-04 DIAGNOSIS — A159 Respiratory tuberculosis unspecified: Secondary | ICD-10-CM

## 2021-10-04 NOTE — Progress Notes (Signed)
TBM administered via video DOT per Dr. Kim Newton orders. Patient tolerated well. Observed patient taking Moxifloxacin 400 mg (1 tablet) and Rifampin 600 mg (2 capsules). Advised patient to call [facility] to report any signs and symptoms ASAP and to hold TB medication if symptoms occur.  No sputums or labs collected today. ? ?Kathleen Schild M, MD ? ?

## 2021-10-07 ENCOUNTER — Ambulatory Visit (LOCAL_COMMUNITY_HEALTH_CENTER): Payer: Self-pay | Admitting: Surgery

## 2021-10-07 DIAGNOSIS — A159 Respiratory tuberculosis unspecified: Secondary | ICD-10-CM

## 2021-10-07 NOTE — Progress Notes (Signed)
TBM administered via video DOT per Dr. Kim Newton orders. Patient tolerated well. Observed patient taking Moxifloxacin 400 mg (1 tablet) and Rifampin 600 mg (2 capsules). Advised patient to call [facility] to report any signs and symptoms ASAP and to hold TB medication if symptoms occur.  No sputums or labs collected today. ? ?Seven Dollens M, MD ? ?

## 2021-10-08 ENCOUNTER — Ambulatory Visit (LOCAL_COMMUNITY_HEALTH_CENTER): Payer: Self-pay | Admitting: Surgery

## 2021-10-08 DIAGNOSIS — A159 Respiratory tuberculosis unspecified: Secondary | ICD-10-CM

## 2021-10-08 NOTE — Progress Notes (Signed)
TBM administered via video DOT per Dr. Lubertha Sayres orders. Patient tolerated well. Observed patient taking Moxifloxacin 400 mg (1 tablet) and Rifampin 600 mg (2 capsules). Advised patient to call [facility] to report any signs and symptoms ASAP and to hold TB medication if symptoms occur.  No sputums or labs collected today.   Kathleen Aurora, MD

## 2021-10-09 ENCOUNTER — Ambulatory Visit (LOCAL_COMMUNITY_HEALTH_CENTER): Payer: Self-pay | Admitting: Surgery

## 2021-10-09 DIAGNOSIS — A159 Respiratory tuberculosis unspecified: Secondary | ICD-10-CM

## 2021-10-09 NOTE — Progress Notes (Signed)
TBM administered via video DOT per Dr. Kim Newton orders. Patient tolerated well. Observed patient taking Moxifloxacin 400 mg (1 tablet) and Rifampin 600 mg (2 capsules). Advised patient to call [facility] to report any signs and symptoms ASAP and to hold TB medication if symptoms occur.  No sputums or labs collected today. ? ?Chayanne Speir M, MD ? ?

## 2021-10-10 ENCOUNTER — Ambulatory Visit (LOCAL_COMMUNITY_HEALTH_CENTER): Payer: Self-pay | Admitting: Surgery

## 2021-10-10 DIAGNOSIS — A159 Respiratory tuberculosis unspecified: Secondary | ICD-10-CM

## 2021-10-10 NOTE — Progress Notes (Signed)
TBM administered via video DOT per Dr. Kim Newton orders. Patient tolerated well. Observed patient taking Moxifloxacin 400 mg (1 tablet) and Rifampin 600 mg (2 capsules). Advised patient to call [facility] to report any signs and symptoms ASAP and to hold TB medication if symptoms occur.  No sputums or labs collected today. ? ?Kathleen Stephen M, MD ? ?

## 2021-10-11 ENCOUNTER — Encounter: Payer: Self-pay | Admitting: Surgery

## 2021-10-11 ENCOUNTER — Ambulatory Visit (LOCAL_COMMUNITY_HEALTH_CENTER): Payer: Self-pay | Admitting: Surgery

## 2021-10-11 DIAGNOSIS — A159 Respiratory tuberculosis unspecified: Secondary | ICD-10-CM

## 2021-10-11 NOTE — Progress Notes (Signed)
Patient accidentally sent duplicate video from previous visit. Today does not count as a DOT completed.  Leigh Aurora, MD

## 2021-10-14 ENCOUNTER — Ambulatory Visit (LOCAL_COMMUNITY_HEALTH_CENTER): Payer: Self-pay | Admitting: Surgery

## 2021-10-14 DIAGNOSIS — A159 Respiratory tuberculosis unspecified: Secondary | ICD-10-CM

## 2021-10-14 NOTE — Progress Notes (Signed)
TBM administered via video DOT per Dr. Karyl Kinnier orders. Patient tolerated well. Observed patient taking Moxifloxacin 400 mg (1 tablet) and Rifampin 600 mg (2 capsules). Advised patient to call [facility] to report any signs and symptoms ASAP and to hold TB medication if symptoms occur.  No sputums or labs collected today.   Patient also observed as above on both Saturday (1/14) and Sunday (1/15).  Jennye Moccasin, MD

## 2021-10-15 ENCOUNTER — Ambulatory Visit (LOCAL_COMMUNITY_HEALTH_CENTER): Payer: Self-pay | Admitting: Surgery

## 2021-10-15 DIAGNOSIS — A159 Respiratory tuberculosis unspecified: Secondary | ICD-10-CM

## 2021-10-15 NOTE — Progress Notes (Signed)
TBM administered via video DOT per Dr. Kim Newton orders. Patient tolerated well. Observed patient taking Moxifloxacin 400 mg (1 tablet) and Rifampin 600 mg (2 capsules). Advised patient to call [facility] to report any signs and symptoms ASAP and to hold TB medication if symptoms occur.  No sputums or labs collected today. ? ?Kathleen Wade M, MD ? ?

## 2021-10-16 ENCOUNTER — Ambulatory Visit (LOCAL_COMMUNITY_HEALTH_CENTER): Payer: Self-pay | Admitting: Surgery

## 2021-10-16 DIAGNOSIS — A159 Respiratory tuberculosis unspecified: Secondary | ICD-10-CM

## 2021-10-16 NOTE — Progress Notes (Signed)
TBM administered via video DOT per Dr. Kim Newton orders. Patient tolerated well. Observed patient taking Moxifloxacin 400 mg (1 tablet) and Rifampin 600 mg (2 capsules). Advised patient to call [facility] to report any signs and symptoms ASAP and to hold TB medication if symptoms occur.  No sputums or labs collected today. ? ?Kathleen Wade M, MD ? ?

## 2021-10-17 ENCOUNTER — Encounter: Payer: Self-pay | Admitting: Surgery

## 2021-10-17 ENCOUNTER — Ambulatory Visit (LOCAL_COMMUNITY_HEALTH_CENTER): Payer: Self-pay | Admitting: Surgery

## 2021-10-17 DIAGNOSIS — A159 Respiratory tuberculosis unspecified: Secondary | ICD-10-CM

## 2021-10-17 NOTE — Progress Notes (Signed)
TBM administered via video DOT per Dr. Lubertha Sayres orders. Patient tolerated well. Observed patient taking Moxifloxacin 400 mg (1 tablet), Rifampin 600 mg (2 capsules) and Pyrazinamide 1000 mg (2 tablets). Advised patient to call [facility] to report any signs and symptoms ASAP and to hold TB medication if symptoms occur.  No sputums or labs collected today.   Leigh Aurora, MD

## 2021-10-18 ENCOUNTER — Ambulatory Visit (LOCAL_COMMUNITY_HEALTH_CENTER): Payer: Self-pay | Admitting: Surgery

## 2021-10-18 DIAGNOSIS — A159 Respiratory tuberculosis unspecified: Secondary | ICD-10-CM

## 2021-10-18 NOTE — Progress Notes (Signed)
TBM administered via video DOT per Dr. Kim Newton orders. Patient tolerated well. Observed patient taking Moxifloxacin 400 mg (1 tablet) and Rifampin 600 mg (2 capsules). Advised patient to call [facility] to report any signs and symptoms ASAP and to hold TB medication if symptoms occur.  No sputums or labs collected today. ? ?Talvin Christianson M, MD ? ?

## 2021-10-21 ENCOUNTER — Encounter: Payer: Self-pay | Admitting: Surgery

## 2021-10-21 ENCOUNTER — Ambulatory Visit (LOCAL_COMMUNITY_HEALTH_CENTER): Payer: Self-pay | Admitting: Surgery

## 2021-10-21 DIAGNOSIS — A159 Respiratory tuberculosis unspecified: Secondary | ICD-10-CM

## 2021-10-21 NOTE — Progress Notes (Signed)
TBM administered via video DOT per Dr. Kim Newton orders. Patient tolerated well. Observed patient taking Moxifloxacin 400 mg (1 tablet) and Rifampin 600 mg (2 capsules). Advised patient to call [facility] to report any signs and symptoms ASAP and to hold TB medication if symptoms occur.  No sputums or labs collected today. ? ?Khamille Beynon M, MD ? ?

## 2021-10-21 NOTE — Progress Notes (Signed)
TBM administered via video DOT per Dr. Karyl Kinnier orders. Patient tolerated well. Observed patient taking Moxifloxacin 400 mg (1 tablet) and Rifampin 600 mg (2 capsules). Advised patient to call [facility] to report any signs and symptoms ASAP and to hold TB medication if symptoms occur.  No sputums or labs collected today.   (Late entry but patient video was viewed on 10/17/2021.)  Jennye Moccasin, MD

## 2021-10-22 ENCOUNTER — Encounter: Payer: Self-pay | Admitting: Surgery

## 2021-10-22 ENCOUNTER — Ambulatory Visit (LOCAL_COMMUNITY_HEALTH_CENTER): Payer: Self-pay | Admitting: Surgery

## 2021-10-22 DIAGNOSIS — A159 Respiratory tuberculosis unspecified: Secondary | ICD-10-CM

## 2021-10-22 NOTE — Progress Notes (Signed)
TBM administered via video DOT per Dr. Kim Newton orders. Patient tolerated well. Observed patient taking Moxifloxacin 400 mg (1 tablet) and Rifampin 600 mg (2 capsules). Advised patient to call [facility] to report any signs and symptoms ASAP and to hold TB medication if symptoms occur.  No sputums or labs collected today. ? ?Kathleen Wade M, MD ? ?

## 2021-10-23 ENCOUNTER — Ambulatory Visit (LOCAL_COMMUNITY_HEALTH_CENTER): Payer: Self-pay | Admitting: Surgery

## 2021-10-23 DIAGNOSIS — A159 Respiratory tuberculosis unspecified: Secondary | ICD-10-CM

## 2021-10-23 NOTE — Progress Notes (Signed)
TBM administered via video DOT per Dr. Lubertha Sayres orders. Patient tolerated well. Observed patient taking Moxifloxacin 400 mg (1 tablet) and Rifampin 600 mg (2 capsules). Advised patient to call [facility] to report any signs and symptoms ASAP and to hold TB medication if symptoms occur.  No sputums or labs collected today.   Leigh Aurora, MD

## 2021-10-24 ENCOUNTER — Ambulatory Visit (LOCAL_COMMUNITY_HEALTH_CENTER): Payer: Self-pay | Admitting: Surgery

## 2021-10-24 DIAGNOSIS — A159 Respiratory tuberculosis unspecified: Secondary | ICD-10-CM

## 2021-10-25 ENCOUNTER — Ambulatory Visit (LOCAL_COMMUNITY_HEALTH_CENTER): Payer: Self-pay | Admitting: Surgery

## 2021-10-25 DIAGNOSIS — A159 Respiratory tuberculosis unspecified: Secondary | ICD-10-CM

## 2021-10-25 MED ORDER — MOXIFLOXACIN HCL 400 MG PO TABS: 400.0000 mg | ORAL_TABLET | Freq: Every day | ORAL | 0 refills | Status: AC

## 2021-10-25 MED ORDER — RIFAMPIN 300 MG PO CAPS: 600.0000 mg | ORAL_CAPSULE | Freq: Every day | ORAL | 0 refills | Status: AC

## 2021-10-25 NOTE — Progress Notes (Addendum)
Dispensed meds to patient in person:  Rifampin 300mg  tab, #60 Moxifloxacin 400mg  tab #30  , MD

## 2021-10-28 ENCOUNTER — Ambulatory Visit (LOCAL_COMMUNITY_HEALTH_CENTER): Payer: Self-pay | Admitting: Surgery

## 2021-10-28 DIAGNOSIS — A159 Respiratory tuberculosis unspecified: Secondary | ICD-10-CM

## 2021-10-28 NOTE — Progress Notes (Signed)
TBM administered via video DOT per Dr. Kim Newton orders. Patient tolerated well. Observed patient taking Moxifloxacin 400 mg (1 tablet) and Rifampin 600 mg (2 capsules). Advised patient to call [facility] to report any signs and symptoms ASAP and to hold TB medication if symptoms occur.  No sputums or labs collected today. ? ?Kathleen Wade M, MD ? ?

## 2021-10-29 ENCOUNTER — Ambulatory Visit (LOCAL_COMMUNITY_HEALTH_CENTER): Payer: Self-pay | Admitting: Surgery

## 2021-10-29 DIAGNOSIS — A159 Respiratory tuberculosis unspecified: Secondary | ICD-10-CM

## 2021-10-29 NOTE — Progress Notes (Signed)
TBM administered via video DOT per Dr. Lubertha Sayres orders. Patient tolerated well. Observed patient taking Moxifloxacin 400 mg (1 tablet) and Rifampin 600 mg (2 capsules). Advised patient to call [facility] to report any signs and symptoms ASAP and to hold TB medication if symptoms occur.  No sputums or labs collected today.   Leigh Aurora, MD

## 2021-10-30 ENCOUNTER — Ambulatory Visit (LOCAL_COMMUNITY_HEALTH_CENTER): Payer: Self-pay | Admitting: Surgery

## 2021-10-30 ENCOUNTER — Other Ambulatory Visit: Payer: Self-pay

## 2021-10-30 VITALS — Wt 94.0 lb

## 2021-10-30 DIAGNOSIS — A159 Respiratory tuberculosis unspecified: Secondary | ICD-10-CM

## 2021-10-30 NOTE — Progress Notes (Signed)
TBM administered DOT in person per Dr. Karyl Kinnier orders. Patient tolerated well. Observed patient taking Moxifloxacin 400 mg (1 tablet) and Rifampin 600 mg (2 capsules). Advised patient to call [facility] to report any signs and symptoms ASAP and to hold TB medication if symptoms occur.  No sputums collected today.   LFTs drawn today. Patient reports doing well, maintaining weight, no concerning side effects. Will follow up on 11/22/2021.  Jennye Moccasin, MD

## 2021-10-31 ENCOUNTER — Ambulatory Visit (LOCAL_COMMUNITY_HEALTH_CENTER): Payer: Self-pay | Admitting: Surgery

## 2021-10-31 DIAGNOSIS — A159 Respiratory tuberculosis unspecified: Secondary | ICD-10-CM

## 2021-10-31 LAB — HEPATIC FUNCTION PANEL
ALT: 11 IU/L (ref 0–32)
AST: 29 IU/L (ref 0–40)
Albumin: 4.7 g/dL (ref 3.8–4.9)
Alkaline Phosphatase: 88 IU/L (ref 44–121)
Bilirubin Total: <0.2 mg/dL (ref 0.0–1.2)
Bilirubin, Direct: <0.1 mg/dL (ref 0.00–0.40)
Total Protein: 8 g/dL (ref 6.0–8.5)

## 2021-10-31 NOTE — Progress Notes (Signed)
Labs are wnl. Continue current treatment plan.    Jennye Moccasin, MD

## 2021-10-31 NOTE — Progress Notes (Signed)
TBM administered via video DOT per Dr. Karyl Kinnier orders. Patient tolerated well. Observed patient taking Moxifloxacin 400 mg (1 tablet) and Rifampin 600 mg (2 capsules). Advised patient to call [facility] to report any signs and symptoms ASAP and to hold TB medication if symptoms occur.  No sputums or labs collected today.   LFTs are wnl. Continue current treatment plan.   Jennye Moccasin, MD

## 2021-11-01 ENCOUNTER — Ambulatory Visit (LOCAL_COMMUNITY_HEALTH_CENTER): Payer: Self-pay | Admitting: Surgery

## 2021-11-01 DIAGNOSIS — A159 Respiratory tuberculosis unspecified: Secondary | ICD-10-CM

## 2021-11-01 NOTE — Progress Notes (Signed)
TBM administered via video DOT per Dr. Kim Newton orders. Patient tolerated well. Observed patient taking Moxifloxacin 400 mg (1 tablet) and Rifampin 600 mg (2 capsules). Advised patient to call [facility] to report any signs and symptoms ASAP and to hold TB medication if symptoms occur.  No sputums or labs collected today. ? ?Peggie Hornak M, MD ? ?

## 2021-11-04 ENCOUNTER — Ambulatory Visit (LOCAL_COMMUNITY_HEALTH_CENTER): Payer: Self-pay | Admitting: Surgery

## 2021-11-04 DIAGNOSIS — A159 Respiratory tuberculosis unspecified: Secondary | ICD-10-CM

## 2021-11-04 NOTE — Progress Notes (Addendum)
TBM administered via video DOT per Dr. Karyl Kinnier orders. Patient tolerated well. Observed patient taking Moxifloxacin 400 mg (1 tablet) and Rifampin 600 mg (2 capsules). Advised patient to call [facility] to report any signs and symptoms ASAP and to hold TB medication if symptoms occur.  No sputums or labs collected today.   Patient also observed patient taking meds as above on both Saturday 2/4 and Sunday 2/5.  Jennye Moccasin, MD

## 2021-11-05 ENCOUNTER — Ambulatory Visit (LOCAL_COMMUNITY_HEALTH_CENTER): Payer: Self-pay | Admitting: Surgery

## 2021-11-05 ENCOUNTER — Encounter: Payer: Self-pay | Admitting: Surgery

## 2021-11-05 DIAGNOSIS — A159 Respiratory tuberculosis unspecified: Secondary | ICD-10-CM

## 2021-11-05 NOTE — Progress Notes (Signed)
TBM administered via video DOT per Dr. Kim Newton orders. Patient tolerated well. Observed patient taking Moxifloxacin 400 mg (1 tablet) and Rifampin 600 mg (2 capsules). Advised patient to call [facility] to report any signs and symptoms ASAP and to hold TB medication if symptoms occur.  No sputums or labs collected today. ? ?Neddie Steedman M, MD ? ?

## 2021-11-06 ENCOUNTER — Ambulatory Visit (LOCAL_COMMUNITY_HEALTH_CENTER): Payer: Self-pay | Admitting: Surgery

## 2021-11-06 DIAGNOSIS — A159 Respiratory tuberculosis unspecified: Secondary | ICD-10-CM

## 2021-11-06 NOTE — Progress Notes (Signed)
TBM administered via video DOT per Dr. Kim Newton orders. Patient tolerated well. Observed patient taking Moxifloxacin 400 mg (1 tablet) and Rifampin 600 mg (2 capsules). Advised patient to call [facility] to report any signs and symptoms ASAP and to hold TB medication if symptoms occur.  No sputums or labs collected today. ? ?Kathleen Wade M, MD ? ?

## 2021-11-07 ENCOUNTER — Ambulatory Visit (LOCAL_COMMUNITY_HEALTH_CENTER): Payer: Self-pay | Admitting: Surgery

## 2021-11-07 DIAGNOSIS — A159 Respiratory tuberculosis unspecified: Secondary | ICD-10-CM

## 2021-11-07 NOTE — Progress Notes (Signed)
TBM administered via video DOT per Dr. Lubertha Sayres orders. Patient tolerated well. Observed patient taking Moxifloxacin 400 mg (1 tablet) and Rifampin 600 mg (2 capsules). Advised patient to call [facility] to report any signs and symptoms ASAP and to hold TB medication if symptoms occur.  No sputums or labs collected today.   Leigh Aurora, MD

## 2021-11-08 ENCOUNTER — Ambulatory Visit (LOCAL_COMMUNITY_HEALTH_CENTER): Payer: Self-pay | Admitting: Surgery

## 2021-11-08 DIAGNOSIS — A159 Respiratory tuberculosis unspecified: Secondary | ICD-10-CM

## 2021-11-08 NOTE — Progress Notes (Signed)
TBM administered via video DOT per Dr. Lubertha Sayres orders. Patient tolerated well. Observed patient taking Moxifloxacin 400 mg (1 tablet) and Rifampin 600 mg (2 capsules). Advised patient to call [facility] to report any signs and symptoms ASAP and to hold TB medication if symptoms occur.  No sputums or labs collected today.  Leigh Aurora, MD

## 2021-11-11 ENCOUNTER — Ambulatory Visit (LOCAL_COMMUNITY_HEALTH_CENTER): Payer: Self-pay | Admitting: Surgery

## 2021-11-11 DIAGNOSIS — A159 Respiratory tuberculosis unspecified: Secondary | ICD-10-CM

## 2021-11-11 NOTE — Progress Notes (Signed)
TBM administered via video DOT per Dr. Lubertha Sayres orders. Patient tolerated well. Observed patient taking Moxifloxacin 400 mg (1 tablet) and Rifampin 600 mg (2 capsules). Advised patient to call [facility] to report any signs and symptoms ASAP and to hold TB medication if symptoms occur.  No sputums or labs collected today.   Leigh Aurora, MD

## 2021-11-12 ENCOUNTER — Ambulatory Visit (LOCAL_COMMUNITY_HEALTH_CENTER): Payer: Self-pay | Admitting: Surgery

## 2021-11-12 DIAGNOSIS — A159 Respiratory tuberculosis unspecified: Secondary | ICD-10-CM

## 2021-11-12 NOTE — Progress Notes (Signed)
TBM administered via video DOT per Dr. Kim Newton orders. Patient tolerated well. Observed patient taking Moxifloxacin 400 mg (1 tablet) and Rifampin 600 mg (2 capsules). Advised patient to call [facility] to report any signs and symptoms ASAP and to hold TB medication if symptoms occur.  No sputums or labs collected today. ? ?Cheril Slattery M, MD ? ?

## 2021-11-13 ENCOUNTER — Ambulatory Visit (LOCAL_COMMUNITY_HEALTH_CENTER): Payer: Self-pay | Admitting: Surgery

## 2021-11-13 ENCOUNTER — Encounter: Payer: Self-pay | Admitting: Surgery

## 2021-11-13 DIAGNOSIS — A159 Respiratory tuberculosis unspecified: Secondary | ICD-10-CM

## 2021-11-13 NOTE — Progress Notes (Signed)
TBM administered via video DOT per Dr. Kim Newton orders. Patient tolerated well. Observed patient taking Moxifloxacin 400 mg (1 tablet) and Rifampin 600 mg (2 capsules). Advised patient to call [facility] to report any signs and symptoms ASAP and to hold TB medication if symptoms occur.  No sputums or labs collected today. ? ?Lenor Provencher M, MD ? ?

## 2021-11-14 ENCOUNTER — Ambulatory Visit (LOCAL_COMMUNITY_HEALTH_CENTER): Payer: Self-pay | Admitting: Surgery

## 2021-11-14 DIAGNOSIS — A159 Respiratory tuberculosis unspecified: Secondary | ICD-10-CM

## 2021-11-14 NOTE — Progress Notes (Signed)
TBM administered via video DOT per Dr. Lubertha Sayres orders. Patient tolerated well. Observed patient taking Moxifloxacin 400 mg (1 tablet) and Rifampin 600 mg (2 capsules). Advised patient to call [facility] to report any signs and symptoms ASAP and to hold TB medication if symptoms occur.  No sputums or labs collected today.   Leigh Aurora, MD

## 2021-11-15 ENCOUNTER — Ambulatory Visit (LOCAL_COMMUNITY_HEALTH_CENTER): Payer: Self-pay | Admitting: Surgery

## 2021-11-15 DIAGNOSIS — A159 Respiratory tuberculosis unspecified: Secondary | ICD-10-CM

## 2021-11-16 NOTE — Progress Notes (Signed)
TBM administered via video DOT per Dr. Kim Newton orders. Patient tolerated well. Observed patient taking Moxifloxacin 400 mg (1 tablet) and Rifampin 600 mg (2 capsules). Advised patient to call [facility] to report any signs and symptoms ASAP and to hold TB medication if symptoms occur.  No sputums or labs collected today. ? ?Kathleen Wade M, MD ? ?

## 2021-11-18 ENCOUNTER — Encounter: Payer: Self-pay | Admitting: Surgery

## 2021-11-18 ENCOUNTER — Ambulatory Visit (LOCAL_COMMUNITY_HEALTH_CENTER): Payer: Self-pay | Admitting: Surgery

## 2021-11-18 DIAGNOSIS — A159 Respiratory tuberculosis unspecified: Secondary | ICD-10-CM

## 2021-11-18 NOTE — Progress Notes (Signed)
TBM administered via video DOT per Dr. Kim Newton orders. Patient tolerated well. Observed patient taking Moxifloxacin 400 mg (1 tablet) and Rifampin 600 mg (2 capsules). Advised patient to call [facility] to report any signs and symptoms ASAP and to hold TB medication if symptoms occur.  No sputums or labs collected today. ? ?Kathleen Wade M, MD ? ?

## 2021-11-19 ENCOUNTER — Ambulatory Visit (LOCAL_COMMUNITY_HEALTH_CENTER): Payer: Self-pay | Admitting: Surgery

## 2021-11-19 DIAGNOSIS — A159 Respiratory tuberculosis unspecified: Secondary | ICD-10-CM

## 2021-11-19 NOTE — Progress Notes (Signed)
TBM administered via video DOT per Dr. Kim Newton orders. Patient tolerated well. Observed patient taking Moxifloxacin 400 mg (1 tablet) and Rifampin 600 mg (2 capsules). Advised patient to call [facility] to report any signs and symptoms ASAP and to hold TB medication if symptoms occur.  No sputums or labs collected today. ? ?Kathleen Zufall M, MD ? ?

## 2021-11-20 ENCOUNTER — Ambulatory Visit (LOCAL_COMMUNITY_HEALTH_CENTER): Payer: Self-pay | Admitting: Surgery

## 2021-11-20 DIAGNOSIS — A159 Respiratory tuberculosis unspecified: Secondary | ICD-10-CM

## 2021-11-20 NOTE — Progress Notes (Signed)
TBM administered via video DOT per Dr. Kim Newton orders. Patient tolerated well. Observed patient taking Moxifloxacin 400 mg (1 tablet) and Rifampin 600 mg (2 capsules). Advised patient to call [facility] to report any signs and symptoms ASAP and to hold TB medication if symptoms occur.  No sputums or labs collected today. ? ?Haden Cavenaugh M, MD ? ?

## 2021-11-21 ENCOUNTER — Ambulatory Visit (LOCAL_COMMUNITY_HEALTH_CENTER): Payer: Self-pay | Admitting: Surgery

## 2021-11-21 DIAGNOSIS — A159 Respiratory tuberculosis unspecified: Secondary | ICD-10-CM

## 2021-11-21 NOTE — Progress Notes (Signed)
TBM administered via video DOT per Dr. Kim Newton orders. Patient tolerated well. Observed patient taking Moxifloxacin 400 mg (1 tablet) and Rifampin 600 mg (2 capsules). Advised patient to call [facility] to report any signs and symptoms ASAP and to hold TB medication if symptoms occur.  No sputums or labs collected today. ? ?Molly Maselli M, MD ? ?

## 2021-11-22 ENCOUNTER — Ambulatory Visit (LOCAL_COMMUNITY_HEALTH_CENTER): Payer: Self-pay | Admitting: Surgery

## 2021-11-22 DIAGNOSIS — A159 Respiratory tuberculosis unspecified: Secondary | ICD-10-CM

## 2021-11-22 MED ORDER — MOXIFLOXACIN HCL 400 MG PO TABS: 400.0000 mg | ORAL_TABLET | Freq: Every day | ORAL | 0 refills | Status: AC

## 2021-11-22 MED ORDER — RIFAMPIN 300 MG PO CAPS: 600.0000 mg | ORAL_CAPSULE | Freq: Every day | ORAL | 0 refills | Status: AC

## 2021-11-22 NOTE — Progress Notes (Signed)
TBM administered via video DOT per Dr. Karyl Kinnier orders. Patient tolerated well. Observed patient taking Moxifloxacin 400 mg (1 tablet) and Rifampin 600 mg (2 capsules). Advised patient to call [facility] to report any signs and symptoms ASAP and to hold TB medication if symptoms occur.  No sputums or labs collected today.   Dispensed Rifampin 300mg  tablets, #60 and Moxifloxacin 400mg  tablets, #30.  , MD

## 2021-11-25 ENCOUNTER — Ambulatory Visit (LOCAL_COMMUNITY_HEALTH_CENTER): Payer: Self-pay | Admitting: Surgery

## 2021-11-25 DIAGNOSIS — A159 Respiratory tuberculosis unspecified: Secondary | ICD-10-CM

## 2021-11-26 ENCOUNTER — Encounter: Payer: Self-pay | Admitting: Surgery

## 2021-11-26 ENCOUNTER — Ambulatory Visit (LOCAL_COMMUNITY_HEALTH_CENTER): Payer: Self-pay | Admitting: Surgery

## 2021-11-26 DIAGNOSIS — A159 Respiratory tuberculosis unspecified: Secondary | ICD-10-CM

## 2021-11-26 NOTE — Progress Notes (Signed)
TBM administered via video DOT per Dr. Kim Newton orders. Patient tolerated well. Observed patient taking Moxifloxacin 400 mg (1 tablet) and Rifampin 600 mg (2 capsules). Advised patient to call [facility] to report any signs and symptoms ASAP and to hold TB medication if symptoms occur.  No sputums or labs collected today. ? ?Kathleen Guerrier M, MD ? ?

## 2021-11-26 NOTE — Progress Notes (Signed)
Late entry, patient observed yesterday, 11/25/2021.  TBM administered via video DOT per Dr. Karyl Kinnier orders. Patient tolerated well. Observed patient taking Moxifloxacin 400 mg (1 tablet) and Rifampin 600 mg (2 capsules). Advised patient to call [facility] to report any signs and symptoms ASAP and to hold TB medication if symptoms occur.  No sputums or labs collected today.  Jennye Moccasin, MD

## 2021-11-27 ENCOUNTER — Ambulatory Visit (LOCAL_COMMUNITY_HEALTH_CENTER): Payer: Self-pay | Admitting: Surgery

## 2021-11-27 VITALS — Wt 94.0 lb

## 2021-11-27 DIAGNOSIS — A159 Respiratory tuberculosis unspecified: Secondary | ICD-10-CM

## 2021-11-27 NOTE — Progress Notes (Signed)
A spanish interpreter via the language line was used during today's visit. ? ?Patient reports doing well on current regimen for TB, she is in her continuation phase.  ? ?Discussed with patient that her last day of medication will likely be Feb 07, 2022. 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 ? ?Delivered patient's meds to her in person last week on Friday, dispensing documented in that visit note.  ? ?Patient did not bring her meds with her to this appointment, will observe meds as we often do by video as below. ? ?TBM administered via video DOT per Dr. Karyl Kinnier orders. Patient tolerated well. Observed patient taking Moxifloxacin 400 mg (1 tablet) and Rifampin 600 mg (2 capsules). Advised patient to call [facility] to report any signs and symptoms ASAP and to hold TB medication if symptoms occur.  No sputums collected today. ? ?Counseled patient that we will follow up her LFTs today and I will let her know if there is anything abnormal that could be related to her reported abdominal pain. Otherwise, urged patient to establish care with a PCP, especially if her abdominal pain worsens, it does not seem to be interfering with her appetite of note, possibly constipation, diverticulitis as she pointed to her LLQ. A list of local PCPs was provided to her and her daughter who was with her during this visit. ? ?Jennye Moccasin, MD  ? ?

## 2021-11-28 ENCOUNTER — Ambulatory Visit (LOCAL_COMMUNITY_HEALTH_CENTER): Payer: Self-pay | Admitting: Surgery

## 2021-11-28 ENCOUNTER — Encounter: Payer: Self-pay | Admitting: Surgery

## 2021-11-28 DIAGNOSIS — A159 Respiratory tuberculosis unspecified: Secondary | ICD-10-CM

## 2021-11-28 LAB — HEPATIC FUNCTION PANEL
ALT: 12 IU/L (ref 0–32)
AST: 24 IU/L (ref 0–40)
Albumin: 4.6 g/dL (ref 3.8–4.9)
Alkaline Phosphatase: 100 IU/L (ref 44–121)
Bilirubin Total: <0.2 mg/dL (ref 0.0–1.2)
Bilirubin, Direct: <0.1 mg/dL (ref 0.00–0.40)
Total Protein: 7.9 g/dL (ref 6.0–8.5)

## 2021-11-28 NOTE — Progress Notes (Signed)
TBM administered via video DOT per Dr. Kim Newton orders. Patient tolerated well. Observed patient taking Moxifloxacin 400 mg (1 tablet) and Rifampin 600 mg (2 capsules). Advised patient to call [facility] to report any signs and symptoms ASAP and to hold TB medication if symptoms occur.  No sputums or labs collected today. ? ?Louiza Moor M, MD ? ?

## 2021-11-29 ENCOUNTER — Ambulatory Visit (LOCAL_COMMUNITY_HEALTH_CENTER): Payer: Self-pay | Admitting: Surgery

## 2021-11-29 DIAGNOSIS — A159 Respiratory tuberculosis unspecified: Secondary | ICD-10-CM

## 2021-11-29 NOTE — Progress Notes (Signed)
TBM administered via video DOT per Dr. Kim Newton orders. Patient tolerated well. Observed patient taking Moxifloxacin 400 mg (1 tablet) and Rifampin 600 mg (2 capsules). Advised patient to call [facility] to report any signs and symptoms ASAP and to hold TB medication if symptoms occur.  No sputums or labs collected today. ? ?Kathleen Schnebly M, MD ? ?

## 2021-12-02 ENCOUNTER — Ambulatory Visit: Payer: Self-pay | Admitting: Surgery

## 2021-12-02 ENCOUNTER — Encounter: Payer: Self-pay | Admitting: Surgery

## 2021-12-02 DIAGNOSIS — A159 Respiratory tuberculosis unspecified: Secondary | ICD-10-CM

## 2021-12-02 NOTE — Progress Notes (Signed)
TBM administered via video DOT per Dr. Kim Newton orders. Patient tolerated well. Observed patient taking Moxifloxacin 400 mg (1 tablet) and Rifampin 600 mg (2 capsules). Advised patient to call [facility] to report any signs and symptoms ASAP and to hold TB medication if symptoms occur.  No sputums or labs collected today. ? ?Kathleen Wade M, MD ? ?

## 2021-12-03 ENCOUNTER — Encounter: Payer: Self-pay | Admitting: Surgery

## 2021-12-03 ENCOUNTER — Ambulatory Visit: Payer: Self-pay | Admitting: Surgery

## 2021-12-03 DIAGNOSIS — A159 Respiratory tuberculosis unspecified: Secondary | ICD-10-CM

## 2021-12-03 NOTE — Progress Notes (Signed)
TBM administered via video DOT per Dr. Kim Newton orders. Patient tolerated well. Observed patient taking Moxifloxacin 400 mg (1 tablet) and Rifampin 600 mg (2 capsules). Advised patient to call [facility] to report any signs and symptoms ASAP and to hold TB medication if symptoms occur.  No sputums or labs collected today. ? ?Kathleen Wade M, MD ? ?

## 2021-12-04 ENCOUNTER — Ambulatory Visit (LOCAL_COMMUNITY_HEALTH_CENTER): Payer: Self-pay | Admitting: Surgery

## 2021-12-04 ENCOUNTER — Encounter: Payer: Self-pay | Admitting: Surgery

## 2021-12-04 DIAGNOSIS — A159 Respiratory tuberculosis unspecified: Secondary | ICD-10-CM

## 2021-12-04 NOTE — Progress Notes (Signed)
TBM administered via video DOT per Dr. Kim Newton orders. Patient tolerated well. Observed patient taking Moxifloxacin 400 mg (1 tablet) and Rifampin 600 mg (2 capsules). Advised patient to call [facility] to report any signs and symptoms ASAP and to hold TB medication if symptoms occur.  No sputums or labs collected today. ? ?Frankee Gritz M, MD ? ?

## 2021-12-05 ENCOUNTER — Ambulatory Visit (LOCAL_COMMUNITY_HEALTH_CENTER): Payer: Self-pay | Admitting: Surgery

## 2021-12-05 ENCOUNTER — Encounter: Payer: Self-pay | Admitting: Surgery

## 2021-12-05 DIAGNOSIS — A159 Respiratory tuberculosis unspecified: Secondary | ICD-10-CM

## 2021-12-05 NOTE — Progress Notes (Signed)
TBM administered via video DOT per Dr. Lubertha Sayres orders. Patient tolerated well. Observed patient taking Moxifloxacin 400 mg (1 tablet) and Rifampin 600 mg (2 capsules). Advised patient to call [facility] to report any signs and symptoms ASAP and to hold TB medication if symptoms occur.  No sputums or labs collected today. ? ?Leigh Aurora, MD  ?

## 2021-12-06 ENCOUNTER — Ambulatory Visit (LOCAL_COMMUNITY_HEALTH_CENTER): Payer: Self-pay | Admitting: Surgery

## 2021-12-06 ENCOUNTER — Encounter: Payer: Self-pay | Admitting: Surgery

## 2021-12-06 DIAGNOSIS — A159 Respiratory tuberculosis unspecified: Secondary | ICD-10-CM

## 2021-12-06 NOTE — Progress Notes (Signed)
TBM administered via video DOT per Dr. Kim Newton orders. Patient tolerated well. Observed patient taking Moxifloxacin 400 mg (1 tablet) and Rifampin 600 mg (2 capsules). Advised patient to call [facility] to report any signs and symptoms ASAP and to hold TB medication if symptoms occur.  No sputums or labs collected today. ? ?Kathleen Marcom M, MD ? ?

## 2021-12-09 ENCOUNTER — Ambulatory Visit (LOCAL_COMMUNITY_HEALTH_CENTER): Payer: Self-pay | Admitting: Surgery

## 2021-12-09 DIAGNOSIS — A159 Respiratory tuberculosis unspecified: Secondary | ICD-10-CM

## 2021-12-09 NOTE — Progress Notes (Signed)
TBM administered via video DOT per Dr. Kim Newton orders. Patient tolerated well. Observed patient taking Moxifloxacin 400 mg (1 tablet) and Rifampin 600 mg (2 capsules). Advised patient to call [facility] to report any signs and symptoms ASAP and to hold TB medication if symptoms occur.  No sputums or labs collected today. ? ?Kathleen Wade M, MD ? ?

## 2021-12-10 ENCOUNTER — Ambulatory Visit (LOCAL_COMMUNITY_HEALTH_CENTER): Payer: Self-pay | Admitting: Surgery

## 2021-12-10 DIAGNOSIS — A159 Respiratory tuberculosis unspecified: Secondary | ICD-10-CM

## 2021-12-10 NOTE — Progress Notes (Signed)
TBM administered via video DOT per Dr. Kim Newton orders. Patient tolerated well. Observed patient taking Moxifloxacin 400 mg (1 tablet) and Rifampin 600 mg (2 capsules). Advised patient to call [facility] to report any signs and symptoms ASAP and to hold TB medication if symptoms occur.  No sputums or labs collected today. ? ?Alanny Rivers M, MD ? ?

## 2021-12-11 ENCOUNTER — Ambulatory Visit: Payer: Self-pay | Admitting: Surgery

## 2021-12-11 DIAGNOSIS — A159 Respiratory tuberculosis unspecified: Secondary | ICD-10-CM

## 2021-12-11 NOTE — Progress Notes (Signed)
TBM administered via video DOT per Dr. Kim Newton orders. Patient tolerated well. Observed patient taking Moxifloxacin 400 mg (1 tablet) and Rifampin 600 mg (2 capsules). Advised patient to call [facility] to report any signs and symptoms ASAP and to hold TB medication if symptoms occur.  No sputums or labs collected today. ? ?Bryce Kimble M, MD ? ?

## 2021-12-12 ENCOUNTER — Ambulatory Visit: Payer: Self-pay | Admitting: Surgery

## 2021-12-12 NOTE — Progress Notes (Signed)
TBM administered via video DOT per Dr. Kim Newton orders. Patient tolerated well. Observed patient taking Moxifloxacin 400 mg (1 tablet) and Rifampin 600 mg (2 capsules). Advised patient to call [facility] to report any signs and symptoms ASAP and to hold TB medication if symptoms occur.  No sputums or labs collected today. ? ?Gay Rape M, MD ? ?

## 2021-12-13 ENCOUNTER — Encounter: Payer: Self-pay | Admitting: Surgery

## 2021-12-13 ENCOUNTER — Ambulatory Visit: Payer: Self-pay | Admitting: Surgery

## 2021-12-13 DIAGNOSIS — A159 Respiratory tuberculosis unspecified: Secondary | ICD-10-CM

## 2021-12-13 NOTE — Progress Notes (Signed)
TBM administered via video DOT per Dr. Kim Newton orders. Patient tolerated well. Observed patient taking Moxifloxacin 400 mg (1 tablet) and Rifampin 600 mg (2 capsules). Advised patient to call [facility] to report any signs and symptoms ASAP and to hold TB medication if symptoms occur.  No sputums or labs collected today. ? ?Kemet Nijjar M, MD ? ?

## 2021-12-16 ENCOUNTER — Ambulatory Visit: Payer: Self-pay | Admitting: Surgery

## 2021-12-16 DIAGNOSIS — A159 Respiratory tuberculosis unspecified: Secondary | ICD-10-CM

## 2021-12-16 NOTE — Progress Notes (Signed)
TBM administered via video DOT per Dr. Kim Newton orders. Patient tolerated well. Observed patient taking Moxifloxacin 400 mg (1 tablet) and Rifampin 600 mg (2 capsules). Advised patient to call [facility] to report any signs and symptoms ASAP and to hold TB medication if symptoms occur.  No sputums or labs collected today. ? ?Mills Mitton M, MD ? ?

## 2021-12-17 ENCOUNTER — Ambulatory Visit: Payer: Self-pay | Admitting: Surgery

## 2021-12-17 DIAGNOSIS — A159 Respiratory tuberculosis unspecified: Secondary | ICD-10-CM

## 2021-12-17 NOTE — Progress Notes (Signed)
TBM administered via video DOT per Dr. Kim Newton orders. Patient tolerated well. Observed patient taking Moxifloxacin 400 mg (1 tablet) and Rifampin 600 mg (2 capsules). Advised patient to call [facility] to report any signs and symptoms ASAP and to hold TB medication if symptoms occur.  No sputums or labs collected today. ? ?Kathleen Wade M, MD ? ?

## 2021-12-18 ENCOUNTER — Encounter: Payer: Self-pay | Admitting: Surgery

## 2021-12-18 ENCOUNTER — Ambulatory Visit: Payer: Self-pay | Admitting: Surgery

## 2021-12-18 DIAGNOSIS — A159 Respiratory tuberculosis unspecified: Secondary | ICD-10-CM

## 2021-12-18 NOTE — Progress Notes (Signed)
TBM administered via video DOT per Dr. Lubertha Sayres orders. Patient tolerated well. Observed patient taking Moxifloxacin 400 mg (1 tablet) and Rifampin 600 mg (2 capsules). Advised patient to call [facility] to report any signs and symptoms ASAP and to hold TB medication if symptoms occur.  No sputums or labs collected today. ? ?Kathleen Aurora, MD  ?

## 2021-12-19 ENCOUNTER — Ambulatory Visit: Payer: Self-pay | Admitting: Surgery

## 2021-12-19 DIAGNOSIS — A159 Respiratory tuberculosis unspecified: Secondary | ICD-10-CM

## 2021-12-19 NOTE — Progress Notes (Signed)
TBM administered via video DOT per Dr. Kim Newton orders. Patient tolerated well. Observed patient taking Moxifloxacin 400 mg (1 tablet) and Rifampin 600 mg (2 capsules). Advised patient to call [facility] to report any signs and symptoms ASAP and to hold TB medication if symptoms occur.  No sputums or labs collected today. ? ?Clair Alfieri M, MD ? ?

## 2021-12-20 ENCOUNTER — Ambulatory Visit: Payer: Self-pay | Admitting: Surgery

## 2021-12-20 ENCOUNTER — Encounter: Payer: Self-pay | Admitting: Surgery

## 2021-12-20 VITALS — Wt 94.0 lb

## 2021-12-20 DIAGNOSIS — A159 Respiratory tuberculosis unspecified: Secondary | ICD-10-CM

## 2021-12-20 MED ORDER — MOXIFLOXACIN HCL 400 MG PO TABS: 400.0000 mg | ORAL_TABLET | Freq: Every day | ORAL | 0 refills | Status: AC

## 2021-12-20 MED ORDER — RIFAMPIN 300 MG PO CAPS: 600.0000 mg | ORAL_CAPSULE | Freq: Every day | ORAL | 0 refills | Status: AC

## 2021-12-20 NOTE — Progress Notes (Signed)
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  ? ?The patient has no concerning side effects and has been taking 2 rifampin capsules (600mg ) and 1 moxifloxacin tablet (400mg ) under direct observation for the last month/4 months.  ? ?Patient given month #5 of TB meds at visit, dispensed Rifampin 300 mg #60 and moxifloxacin 400mg  #30. Patient to follow up in approximately 30 days for dispensing of next month's meds and review of sx. Counseled to stop meds and report any concerning side effects immediately if side effects occur.  ? ?TBM administered via video DOT per Dr. orders, prior to today's visit. Patient tolerated well. Observed patient taking Moxifloxacin 400 mg (1 tablet) and Rifampin 600 mg (2 capsules). Advised patient to call [facility] to report any signs and symptoms ASAP and to hold TB medication if symptoms occur.   ? ?Patient to have monthly LFTs drawn, no sputums.  ? ?Next visit will be last medication dispensing, will need treatment completion CXR and PE on final visit.  ? ? , MD  ?

## 2021-12-21 LAB — HEPATIC FUNCTION PANEL
ALT: 12 IU/L (ref 0–32)
AST: 27 IU/L (ref 0–40)
Albumin: 4.3 g/dL (ref 3.8–4.9)
Alkaline Phosphatase: 77 IU/L (ref 44–121)
Bilirubin Total: <0.2 mg/dL (ref 0.0–1.2)
Bilirubin, Direct: <0.1 mg/dL (ref 0.00–0.40)
Total Protein: 7.3 g/dL (ref 6.0–8.5)

## 2021-12-23 ENCOUNTER — Encounter: Payer: Self-pay | Admitting: Surgery

## 2021-12-23 ENCOUNTER — Ambulatory Visit: Payer: Self-pay | Admitting: Surgery

## 2021-12-23 DIAGNOSIS — A159 Respiratory tuberculosis unspecified: Secondary | ICD-10-CM

## 2021-12-23 NOTE — Progress Notes (Signed)
TBM administered via video DOT per Dr. Kim Newton orders. Patient tolerated well. Observed patient taking Moxifloxacin 400 mg (1 tablet) and Rifampin 600 mg (2 capsules). Advised patient to call [facility] to report any signs and symptoms ASAP and to hold TB medication if symptoms occur.  No sputums or labs collected today. ? ?Kathleen Wade M, MD ? ?

## 2021-12-24 ENCOUNTER — Ambulatory Visit: Payer: Self-pay | Admitting: Surgery

## 2021-12-24 ENCOUNTER — Encounter: Payer: Self-pay | Admitting: Surgery

## 2021-12-24 DIAGNOSIS — A159 Respiratory tuberculosis unspecified: Secondary | ICD-10-CM

## 2021-12-24 NOTE — Progress Notes (Signed)
TBM administered via video DOT per Dr. Kim Newton orders. Patient tolerated well. Observed patient taking Moxifloxacin 400 mg (1 tablet) and Rifampin 600 mg (2 capsules). Advised patient to call [facility] to report any signs and symptoms ASAP and to hold TB medication if symptoms occur.  No sputums or labs collected today. ? ?Kathleen Wade M, MD ? ?

## 2021-12-25 ENCOUNTER — Ambulatory Visit (LOCAL_COMMUNITY_HEALTH_CENTER): Payer: Self-pay | Admitting: Surgery

## 2021-12-25 ENCOUNTER — Encounter: Payer: Self-pay | Admitting: Surgery

## 2021-12-25 DIAGNOSIS — A159 Respiratory tuberculosis unspecified: Secondary | ICD-10-CM

## 2021-12-25 NOTE — Progress Notes (Signed)
TBM administered via video DOT per Dr. Kim Newton orders. Patient tolerated well. Observed patient taking Moxifloxacin 400 mg (1 tablet) and Rifampin 600 mg (2 capsules). Advised patient to call [facility] to report any signs and symptoms ASAP and to hold TB medication if symptoms occur.  No sputums or labs collected today. ? ?Kathleen Wade M, MD ? ?

## 2021-12-26 ENCOUNTER — Ambulatory Visit (LOCAL_COMMUNITY_HEALTH_CENTER): Payer: Self-pay | Admitting: Surgery

## 2021-12-26 ENCOUNTER — Encounter: Payer: Self-pay | Admitting: Surgery

## 2021-12-26 DIAGNOSIS — A159 Respiratory tuberculosis unspecified: Secondary | ICD-10-CM

## 2021-12-26 NOTE — Progress Notes (Signed)
TBM administered via video DOT per Dr. Lubertha Sayres orders. Patient tolerated well. Observed patient taking Moxifloxacin 400 mg (1 tablet) and Rifampin 600 mg (2 capsules). Advised patient to call [facility] to report any signs and symptoms ASAP and to hold TB medication if symptoms occur.  No sputums or labs collected today.  ? ?Leigh Aurora, MD  ?

## 2021-12-27 ENCOUNTER — Ambulatory Visit (LOCAL_COMMUNITY_HEALTH_CENTER): Payer: Self-pay | Admitting: Surgery

## 2021-12-27 ENCOUNTER — Encounter: Payer: Self-pay | Admitting: Surgery

## 2021-12-27 DIAGNOSIS — A159 Respiratory tuberculosis unspecified: Secondary | ICD-10-CM

## 2021-12-27 NOTE — Progress Notes (Signed)
TBM administered via video DOT per Dr. Kim Newton orders. Patient tolerated well. Observed patient taking Moxifloxacin 400 mg (1 tablet) and Rifampin 600 mg (2 capsules). Advised patient to call [facility] to report any signs and symptoms ASAP and to hold TB medication if symptoms occur.  No sputums or labs collected today. ? ?Kathleen Wade M, MD ? ?

## 2021-12-30 ENCOUNTER — Encounter: Payer: Self-pay | Admitting: Surgery

## 2021-12-30 ENCOUNTER — Ambulatory Visit (LOCAL_COMMUNITY_HEALTH_CENTER): Payer: Self-pay | Admitting: Surgery

## 2021-12-30 DIAGNOSIS — A159 Respiratory tuberculosis unspecified: Secondary | ICD-10-CM

## 2021-12-30 NOTE — Progress Notes (Signed)
TBM administered via video DOT per Dr. Kim Newton orders. Patient tolerated well. Observed patient taking Moxifloxacin 400 mg (1 tablet) and Rifampin 600 mg (2 capsules). Advised patient to call [facility] to report any signs and symptoms ASAP and to hold TB medication if symptoms occur.  No sputums or labs collected today. ? ?Laxmi Choung M, MD ? ?

## 2021-12-31 ENCOUNTER — Ambulatory Visit (LOCAL_COMMUNITY_HEALTH_CENTER): Payer: Self-pay | Admitting: Surgery

## 2021-12-31 ENCOUNTER — Encounter: Payer: Self-pay | Admitting: Surgery

## 2021-12-31 DIAGNOSIS — A159 Respiratory tuberculosis unspecified: Secondary | ICD-10-CM

## 2021-12-31 NOTE — Progress Notes (Signed)
TBM administered via video DOT per Dr. Kim Newton orders. Patient tolerated well. Observed patient taking Moxifloxacin 400 mg (1 tablet) and Rifampin 600 mg (2 capsules). Advised patient to call [facility] to report any signs and symptoms ASAP and to hold TB medication if symptoms occur.  No sputums or labs collected today. ? ?Kathleen Wade M, MD ? ?

## 2022-01-01 ENCOUNTER — Encounter: Payer: Self-pay | Admitting: Surgery

## 2022-01-01 ENCOUNTER — Ambulatory Visit (LOCAL_COMMUNITY_HEALTH_CENTER): Payer: Self-pay | Admitting: Surgery

## 2022-01-01 DIAGNOSIS — A159 Respiratory tuberculosis unspecified: Secondary | ICD-10-CM

## 2022-01-01 NOTE — Progress Notes (Signed)
TBM administered via video DOT per Dr. Kim Newton orders. Patient tolerated well. Observed patient taking Moxifloxacin 400 mg (1 tablet) and Rifampin 600 mg (2 capsules). Advised patient to call [facility] to report any signs and symptoms ASAP and to hold TB medication if symptoms occur.  No sputums or labs collected today. ? ?Kathleen Wade M, MD ? ?

## 2022-01-02 ENCOUNTER — Ambulatory Visit (LOCAL_COMMUNITY_HEALTH_CENTER): Payer: Self-pay | Admitting: Surgery

## 2022-01-02 DIAGNOSIS — A159 Respiratory tuberculosis unspecified: Secondary | ICD-10-CM

## 2022-01-03 ENCOUNTER — Encounter: Payer: Self-pay | Admitting: Surgery

## 2022-01-03 NOTE — Progress Notes (Signed)
TBM administered via video DOT per Dr. Kim Newton orders. Patient tolerated well. Observed patient taking Moxifloxacin 400 mg (1 tablet) and Rifampin 600 mg (2 capsules). Advised patient to call [facility] to report any signs and symptoms ASAP and to hold TB medication if symptoms occur.  No sputums or labs collected today. ? ?Kathleen Wade M, MD ? ?

## 2022-01-06 ENCOUNTER — Ambulatory Visit (LOCAL_COMMUNITY_HEALTH_CENTER): Payer: Self-pay | Admitting: Surgery

## 2022-01-06 DIAGNOSIS — A159 Respiratory tuberculosis unspecified: Secondary | ICD-10-CM

## 2022-01-06 NOTE — Progress Notes (Addendum)
TBM administered via video DOT per Dr. Lubertha Sayres orders. Patient tolerated well. Observed patient taking Moxifloxacin 400 mg (1 tablet) and Rifampin 600 mg (2 capsules). Advised patient to call [facility] to report any signs and symptoms ASAP and to hold TB medication if symptoms occur.  No sputums or labs collected today. ? ?Of note, patient also observed above medication regimen on Friday 01/03/2022 and Saturday 01/04/2022. [facility] closed on Friday/Saturday so could not document on those days. ? ?Leigh Aurora, MD ? ?

## 2022-01-07 ENCOUNTER — Encounter: Payer: Self-pay | Admitting: Surgery

## 2022-01-07 ENCOUNTER — Ambulatory Visit (LOCAL_COMMUNITY_HEALTH_CENTER): Payer: Self-pay | Admitting: Surgery

## 2022-01-07 DIAGNOSIS — A159 Respiratory tuberculosis unspecified: Secondary | ICD-10-CM

## 2022-01-07 NOTE — Progress Notes (Signed)
TBM administered via video DOT per Dr. Kim Newton orders. Patient tolerated well. Observed patient taking Moxifloxacin 400 mg (1 tablet) and Rifampin 600 mg (2 capsules). Advised patient to call [facility] to report any signs and symptoms ASAP and to hold TB medication if symptoms occur.  No sputums or labs collected today. ? ?Kinze Labo M, MD ? ?

## 2022-01-08 ENCOUNTER — Ambulatory Visit (LOCAL_COMMUNITY_HEALTH_CENTER): Payer: Self-pay | Admitting: Surgery

## 2022-01-08 DIAGNOSIS — A159 Respiratory tuberculosis unspecified: Secondary | ICD-10-CM

## 2022-01-08 NOTE — Progress Notes (Signed)
TBM administered via video DOT per Dr. Kim Newton orders. Patient tolerated well. Observed patient taking Moxifloxacin 400 mg (1 tablet) and Rifampin 600 mg (2 capsules). Advised patient to call [facility] to report any signs and symptoms ASAP and to hold TB medication if symptoms occur.  No sputums or labs collected today. ? ?Kathleen Wade M, MD ? ?

## 2022-01-09 ENCOUNTER — Ambulatory Visit (LOCAL_COMMUNITY_HEALTH_CENTER): Payer: Self-pay | Admitting: Surgery

## 2022-01-09 DIAGNOSIS — A159 Respiratory tuberculosis unspecified: Secondary | ICD-10-CM

## 2022-01-09 NOTE — Progress Notes (Signed)
TBM administered via video DOT per Dr. Kim Newton orders. Patient tolerated well. Observed patient taking Moxifloxacin 400 mg (1 tablet) and Rifampin 600 mg (2 capsules). Advised patient to call [facility] to report any signs and symptoms ASAP and to hold TB medication if symptoms occur.  No sputums or labs collected today. ? ?Carine Nordgren M, MD ? ?

## 2022-01-10 ENCOUNTER — Ambulatory Visit (LOCAL_COMMUNITY_HEALTH_CENTER): Payer: Self-pay | Admitting: Surgery

## 2022-01-10 DIAGNOSIS — A159 Respiratory tuberculosis unspecified: Secondary | ICD-10-CM

## 2022-01-10 NOTE — Progress Notes (Signed)
TBM administered via video DOT per Dr. Kim Newton orders. Patient tolerated well. Observed patient taking Moxifloxacin 400 mg (1 tablet) and Rifampin 600 mg (2 capsules). Advised patient to call [facility] to report any signs and symptoms ASAP and to hold TB medication if symptoms occur.  No sputums or labs collected today. ? ?Jt Brabec M, MD ? ?

## 2022-01-13 ENCOUNTER — Ambulatory Visit (LOCAL_COMMUNITY_HEALTH_CENTER): Payer: Self-pay | Admitting: Surgery

## 2022-01-13 DIAGNOSIS — A159 Respiratory tuberculosis unspecified: Secondary | ICD-10-CM

## 2022-01-13 NOTE — Progress Notes (Signed)
TBM administered via video DOT per Dr. Lubertha Sayres orders. Patient tolerated well. Observed patient taking Moxifloxacin 400 mg (1 tablet) and Rifampin 600 mg (2 capsules). Advised patient to call [facility] to report any signs and symptoms ASAP and to hold TB medication if symptoms occur.  No sputums or labs collected today.  ? ?Kathleen Aurora, MD ? ?

## 2022-01-14 ENCOUNTER — Ambulatory Visit (LOCAL_COMMUNITY_HEALTH_CENTER): Payer: Self-pay | Admitting: Surgery

## 2022-01-14 DIAGNOSIS — A159 Respiratory tuberculosis unspecified: Secondary | ICD-10-CM

## 2022-01-14 NOTE — Progress Notes (Signed)
TBM administered via video DOT per Dr. Lubertha Sayres orders. Patient tolerated well. Observed patient taking Moxifloxacin 400 mg (1 tablet) and Rifampin 600 mg (2 capsules). Advised patient to call [facility] to report any signs and symptoms ASAP and to hold TB medication if symptoms occur.  No sputums or labs collected today.  ? ?Leigh Aurora, MD  ?

## 2022-01-15 ENCOUNTER — Encounter: Payer: Self-pay | Admitting: Surgery

## 2022-01-15 ENCOUNTER — Ambulatory Visit (LOCAL_COMMUNITY_HEALTH_CENTER): Payer: Self-pay | Admitting: Surgery

## 2022-01-15 DIAGNOSIS — A159 Respiratory tuberculosis unspecified: Secondary | ICD-10-CM

## 2022-01-15 NOTE — Progress Notes (Signed)
TBM administered via video DOT per Dr. Kim Newton orders. Patient tolerated well. Observed patient taking Moxifloxacin 400 mg (1 tablet) and Rifampin 600 mg (2 capsules). Advised patient to call [facility] to report any signs and symptoms ASAP and to hold TB medication if symptoms occur.  No sputums or labs collected today. ? ?Duong Haydel M, MD ? ?

## 2022-01-16 ENCOUNTER — Ambulatory Visit (LOCAL_COMMUNITY_HEALTH_CENTER): Payer: Self-pay | Admitting: Surgery

## 2022-01-16 DIAGNOSIS — A159 Respiratory tuberculosis unspecified: Secondary | ICD-10-CM

## 2022-01-17 ENCOUNTER — Encounter: Payer: Self-pay | Admitting: Surgery

## 2022-01-17 ENCOUNTER — Ambulatory Visit (LOCAL_COMMUNITY_HEALTH_CENTER): Payer: Self-pay | Admitting: Surgery

## 2022-01-17 DIAGNOSIS — A159 Respiratory tuberculosis unspecified: Secondary | ICD-10-CM

## 2022-01-17 NOTE — Progress Notes (Signed)
TBM administered via video DOT per Dr. Lubertha Sayres orders. Patient tolerated well. Observed patient taking Moxifloxacin 400 mg (1 tablet) and Rifampin 600 mg (2 capsules). Advised patient to call [facility] to report any signs and symptoms ASAP and to hold TB medication if symptoms occur.  No sputums or labs collected today.  ? ?Kathleen Aurora, MD  ?

## 2022-01-17 NOTE — Progress Notes (Signed)
TBM administered via video DOT per Dr. Kim Newton orders. Patient tolerated well. Observed patient taking Moxifloxacin 400 mg (1 tablet) and Rifampin 600 mg (2 capsules). Advised patient to call [facility] to report any signs and symptoms ASAP and to hold TB medication if symptoms occur.  No sputums or labs collected today. ? ?Fiore Detjen M, MD ? ?

## 2022-01-20 ENCOUNTER — Ambulatory Visit (LOCAL_COMMUNITY_HEALTH_CENTER): Payer: Self-pay | Admitting: Surgery

## 2022-01-20 ENCOUNTER — Encounter: Payer: Self-pay | Admitting: Surgery

## 2022-01-20 DIAGNOSIS — A159 Respiratory tuberculosis unspecified: Secondary | ICD-10-CM

## 2022-01-20 NOTE — Progress Notes (Signed)
TBM administered via video DOT per Dr. Lubertha Sayres orders. Patient tolerated well. Observed patient taking Moxifloxacin 400 mg (1 tablet) and Rifampin 600 mg (2 capsules). Advised patient to call [facility] to report any signs and symptoms ASAP and to hold TB medication if symptoms occur.  No sputums or labs collected today. ? ?Kathleen Aurora, MD  ?

## 2022-01-21 ENCOUNTER — Ambulatory Visit (LOCAL_COMMUNITY_HEALTH_CENTER): Payer: Self-pay | Admitting: Surgery

## 2022-01-21 DIAGNOSIS — A159 Respiratory tuberculosis unspecified: Secondary | ICD-10-CM

## 2022-01-21 NOTE — Progress Notes (Signed)
TBM administered via video DOT per Dr. Kim Newton orders. Patient tolerated well. Observed patient taking Moxifloxacin 400 mg (1 tablet) and Rifampin 600 mg (2 capsules). Advised patient to call [facility] to report any signs and symptoms ASAP and to hold TB medication if symptoms occur.  No sputums or labs collected today. ? ?Edgardo Petrenko M, MD ? ?

## 2022-01-22 ENCOUNTER — Ambulatory Visit (LOCAL_COMMUNITY_HEALTH_CENTER): Payer: Self-pay | Admitting: Surgery

## 2022-01-22 VITALS — Wt 95.5 lb

## 2022-01-22 DIAGNOSIS — A159 Respiratory tuberculosis unspecified: Secondary | ICD-10-CM

## 2022-01-22 MED ORDER — RIFAMPIN 300 MG PO CAPS: 600.0000 mg | ORAL_CAPSULE | Freq: Every day | ORAL | 0 refills | Status: AC

## 2022-01-22 MED ORDER — MOXIFLOXACIN HCL 400 MG PO TABS: 400.0000 mg | ORAL_TABLET | Freq: Every day | ORAL | 0 refills | Status: AC

## 2022-01-22 NOTE — Progress Notes (Signed)
Spanish interpreter Salli Real was present for visit. ? ?TBM administered via video DOT per Dr. Karyl Kinnier orders, prior to in person portion of visit. Patient tolerated well. Observed patient taking Moxifloxacin 400 mg (1 tablet) and Rifampin 600 mg (2 capsules). Advised patient to call [facility] to report any signs and symptoms ASAP and to hold TB medication if symptoms occur.   ? ?No sputums collected today. ? ?LFTs drawn today in clinic. Patient is doing well, minimal side effects, eating well, gaining weight.  ? ?Dispensed 20 days worth of TB medication, moxifloxacin 400mg  #20 for 1 tab daily; Rifampin 300mg , #40 for 2 tabs daily for 20 more days.  ? ?This is her last medication dispensing visit. Patient was instructed to obtain an outpatient CXR anytime starting May 8th and prior to her last visit on May 19th during which she will have a physical exam and obtain her documentation for completion of treatment.  ? ?May 10, MD  ? ?

## 2022-01-23 ENCOUNTER — Ambulatory Visit (LOCAL_COMMUNITY_HEALTH_CENTER): Payer: Self-pay | Admitting: Surgery

## 2022-01-23 DIAGNOSIS — A159 Respiratory tuberculosis unspecified: Secondary | ICD-10-CM

## 2022-01-23 LAB — HEPATIC FUNCTION PANEL
ALT: 6 IU/L (ref 0–32)
AST: 22 IU/L (ref 0–40)
Albumin: 4.5 g/dL (ref 3.8–4.9)
Alkaline Phosphatase: 96 IU/L (ref 44–121)
Bilirubin Total: <0.2 mg/dL (ref 0.0–1.2)
Bilirubin, Direct: <0.1 mg/dL (ref 0.00–0.40)
Total Protein: 7.4 g/dL (ref 6.0–8.5)

## 2022-01-23 NOTE — Progress Notes (Signed)
TBM administered via video DOT per Dr. Lubertha Sayres orders. Patient tolerated well. Observed patient taking Moxifloxacin 400 mg (1 tablet) and Rifampin 600 mg (2 capsules). Advised patient to call [facility] to report any signs and symptoms ASAP and to hold TB medication if symptoms occur.  No sputums or labs collected today. ? ?Leigh Aurora, MD ? ?

## 2022-01-24 ENCOUNTER — Ambulatory Visit (LOCAL_COMMUNITY_HEALTH_CENTER): Payer: Self-pay | Admitting: Surgery

## 2022-01-24 DIAGNOSIS — A159 Respiratory tuberculosis unspecified: Secondary | ICD-10-CM

## 2022-01-26 NOTE — Progress Notes (Signed)
TBM administered via video DOT per Dr. Lubertha Sayres orders. Patient tolerated well. Observed patient taking Moxifloxacin 400 mg (1 tablet) and Rifampin 600 mg (2 capsules). Advised patient to call [facility] to report any signs and symptoms ASAP and to hold TB medication if symptoms occur.  No sputums or labs collected today. ? ?Leigh Aurora, MD ? ?

## 2022-01-27 ENCOUNTER — Encounter: Payer: Self-pay | Admitting: Surgery

## 2022-01-27 ENCOUNTER — Ambulatory Visit (LOCAL_COMMUNITY_HEALTH_CENTER): Payer: Self-pay | Admitting: Surgery

## 2022-01-27 DIAGNOSIS — A159 Respiratory tuberculosis unspecified: Secondary | ICD-10-CM

## 2022-01-27 NOTE — Progress Notes (Signed)
TBM administered via video DOT per Dr. Kim Newton orders. Patient tolerated well. Observed patient taking Moxifloxacin 400 mg (1 tablet) and Rifampin 600 mg (2 capsules). Advised patient to call [facility] to report any signs and symptoms ASAP and to hold TB medication if symptoms occur.  No sputums or labs collected today. ? ?Erskin Zinda M, MD ? ?

## 2022-01-28 ENCOUNTER — Ambulatory Visit (LOCAL_COMMUNITY_HEALTH_CENTER): Payer: Self-pay | Admitting: Surgery

## 2022-01-28 ENCOUNTER — Encounter: Payer: Self-pay | Admitting: Surgery

## 2022-01-28 DIAGNOSIS — A159 Respiratory tuberculosis unspecified: Secondary | ICD-10-CM

## 2022-01-28 NOTE — Progress Notes (Signed)
TBM administered via video DOT per Dr. Lubertha Sayres orders. Patient tolerated well. Observed patient taking Moxifloxacin 400 mg (1 tablet) and Rifampin 600 mg (2 capsules). Advised patient to call [facility] to report any signs and symptoms ASAP and to hold TB medication if symptoms occur.  No sputums or labs collected today. ? ?Leigh Aurora, MD ? ?

## 2022-01-29 ENCOUNTER — Ambulatory Visit (LOCAL_COMMUNITY_HEALTH_CENTER): Payer: Self-pay | Admitting: Surgery

## 2022-01-29 DIAGNOSIS — A159 Respiratory tuberculosis unspecified: Secondary | ICD-10-CM

## 2022-01-30 ENCOUNTER — Ambulatory Visit (LOCAL_COMMUNITY_HEALTH_CENTER): Payer: Self-pay | Admitting: Surgery

## 2022-01-30 DIAGNOSIS — A159 Respiratory tuberculosis unspecified: Secondary | ICD-10-CM

## 2022-01-30 NOTE — Progress Notes (Signed)
TBM administered via video DOT per Dr. Lubertha Sayres orders. Patient tolerated well. Observed patient taking Moxifloxacin 400 mg (1 tablet) and Rifampin 600 mg (2 capsules). Advised patient to call [facility] to report any signs and symptoms ASAP and to hold TB medication if symptoms occur.  No sputums or labs collected today.  ? ?Leigh Aurora, MD  ?

## 2022-01-30 NOTE — Progress Notes (Signed)
Patient did not record video correctly. Explained to patient we have 3 videos for the week so far, be very careful with next couple days of video recording, need 2 more days for the week of treatment to count.  ? ?Jennye Moccasin, MD  ?

## 2022-01-31 ENCOUNTER — Ambulatory Visit (LOCAL_COMMUNITY_HEALTH_CENTER): Payer: Self-pay | Admitting: Surgery

## 2022-01-31 DIAGNOSIS — A159 Respiratory tuberculosis unspecified: Secondary | ICD-10-CM

## 2022-02-01 NOTE — Progress Notes (Signed)
TBM administered via video DOT per Dr. Kim Newton orders. Patient tolerated well. Observed patient taking Moxifloxacin 400 mg (1 tablet) and Rifampin 600 mg (2 capsules). Advised patient to call [facility] to report any signs and symptoms ASAP and to hold TB medication if symptoms occur.  No sputums or labs collected today. ? ?Aleyssa Pike M, MD ? ?

## 2022-02-03 ENCOUNTER — Ambulatory Visit (LOCAL_COMMUNITY_HEALTH_CENTER): Payer: Self-pay | Admitting: Surgery

## 2022-02-03 DIAGNOSIS — A159 Respiratory tuberculosis unspecified: Secondary | ICD-10-CM

## 2022-02-03 NOTE — Progress Notes (Signed)
TBM administered via video DOT per Dr. Kim Newton orders. Patient tolerated well. Observed patient taking Moxifloxacin 400 mg (1 tablet) and Rifampin 600 mg (2 capsules). Advised patient to call [facility] to report any signs and symptoms ASAP and to hold TB medication if symptoms occur.  No sputums or labs collected today. ? ?Kathleen Wade M, MD ? ?

## 2022-02-04 ENCOUNTER — Ambulatory Visit (LOCAL_COMMUNITY_HEALTH_CENTER): Payer: Self-pay | Admitting: Surgery

## 2022-02-04 ENCOUNTER — Other Ambulatory Visit: Payer: Self-pay | Admitting: Surgery

## 2022-02-04 DIAGNOSIS — A159 Respiratory tuberculosis unspecified: Secondary | ICD-10-CM

## 2022-02-04 NOTE — Progress Notes (Addendum)
Patient is near completion of treatment for active tuberculosis. Order placed today for end of treatment CXR.  ? ?TBM administered via video DOT per Dr. Karyl Kinnier orders. Patient tolerated well. Observed patient taking Moxifloxacin 400 mg (1 tablet) and Rifampin 600 mg (2 capsules). Advised patient to call [facility] to report any signs and symptoms ASAP and to hold TB medication if symptoms occur.  No sputums or labs collected today. ? ?Jennye Moccasin, MD  ?

## 2022-02-05 ENCOUNTER — Ambulatory Visit (LOCAL_COMMUNITY_HEALTH_CENTER): Payer: Self-pay | Admitting: Surgery

## 2022-02-05 DIAGNOSIS — A159 Respiratory tuberculosis unspecified: Secondary | ICD-10-CM

## 2022-02-06 ENCOUNTER — Encounter: Payer: Self-pay | Admitting: Surgery

## 2022-02-06 ENCOUNTER — Ambulatory Visit (LOCAL_COMMUNITY_HEALTH_CENTER): Payer: Self-pay | Admitting: Surgery

## 2022-02-06 DIAGNOSIS — A159 Respiratory tuberculosis unspecified: Secondary | ICD-10-CM

## 2022-02-06 NOTE — Progress Notes (Signed)
TBM administered via video DOT per Dr. Lubertha Sayres orders. Patient tolerated well. Observed patient taking Moxifloxacin 400 mg (1 tablet) and Rifampin 600 mg (2 capsules). Advised patient to call [facility] to report any signs and symptoms ASAP and to hold TB medication if symptoms occur.  No sputums or labs collected today. ? ?Leigh Aurora, MD  ?

## 2022-02-06 NOTE — Progress Notes (Addendum)
Patient is near completion of treatment for active tuberculosis. ? ?Patient and patient's daughter notified by text with instructions to go to [facility] as a walk-in appt. Instructed to NOT GO to the urgent care, go to the outpatient radiology clinic. This was discussed in person at patient's last in-person appointment as well.  ? ?TBM administered via video DOT per Dr. Lubertha Sayres orders. Patient tolerated well. Observed patient taking Moxifloxacin 400 mg (1 tablet) and Rifampin 600 mg (2 capsules). Advised patient to call [facility] to report any signs and symptoms ASAP and to hold TB medication if symptoms occur.  No sputums or labs collected today. ? ?Leigh Aurora, MD  ?

## 2022-02-07 ENCOUNTER — Encounter: Payer: Self-pay | Admitting: Surgery

## 2022-02-07 ENCOUNTER — Ambulatory Visit (LOCAL_COMMUNITY_HEALTH_CENTER): Payer: Self-pay | Admitting: Surgery

## 2022-02-07 DIAGNOSIS — A159 Respiratory tuberculosis unspecified: Secondary | ICD-10-CM

## 2022-02-07 NOTE — Progress Notes (Signed)
Today is the last day of the patient's medication regimen for TB!!!!! ? ?TBM administered via video DOT per Dr. Karyl Kinnier orders. Patient tolerated well. Observed patient taking Moxifloxacin 400 mg (1 tablet) and Rifampin 600 mg (2 capsules). Advised patient to call [facility] to report any signs and symptoms ASAP and to hold TB medication if symptoms occur.  No sputums or labs collected today.  ? ?TB treatment completed.  ? ?CXR order has been placed (5/9), patient to obtain prior to last visit for medical exam and dispersal of completion paperwork. Last appointment to be scheduled, contact patient next week regarding dates. ? ?Optician, dispensing!!! ? ?Jennye Moccasin, MD  ?

## 2022-02-11 ENCOUNTER — Ambulatory Visit
Admission: RE | Admit: 2022-02-11 | Discharge: 2022-02-11 | Disposition: A | Discharge status: Home / Self Care | Payer: Self-pay | Attending: Surgery | Admitting: Surgery

## 2022-02-11 ENCOUNTER — Ambulatory Visit
Admission: RE | Admit: 2022-02-11 | Discharge: 2022-02-11 | Disposition: A | Discharge status: Home / Self Care | Payer: Self-pay | Source: Ambulatory Visit | Attending: Surgery | Admitting: Surgery

## 2022-02-11 DIAGNOSIS — A159 Respiratory tuberculosis unspecified: Secondary | ICD-10-CM

## 2022-02-28 ENCOUNTER — Ambulatory Visit (LOCAL_COMMUNITY_HEALTH_CENTER): Payer: Self-pay | Admitting: Surgery

## 2022-02-28 VITALS — Wt 94.0 lb

## 2022-02-28 DIAGNOSIS — A159 Respiratory tuberculosis unspecified: Secondary | ICD-10-CM

## 2022-02-28 NOTE — Progress Notes (Unsigned)
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 A successful tx regimen was started in 07/2021. Her treatment regimen was as follows:  CXR from [place]: CXR 05/17/2021 "bilateral airspace opacities"  Started R[facility] [place]: 05/20/2021  Initial [facility] CXR: 06/11/2021 "architectural distortion in the upper lungs, right side greater than left. Evidence for cystic changes or bronchiectasis in the upper lungs. Pleural-based densities along the lung apices, right side greater than left. Questionable 2.0 cm round nodular density near the right lung apex."  (re)Start of medication: 08/05/2021 RIF/PZA/EMB  First day all 4 meds: 08/08/2022 RIF/PZA/EMB + MOXI  Last day of treatment: 02/07/2022  RIF/MOXI  End of tx CXR: 02/11/2022- "Stable bilateral upper lobe opacities are noted with associated architectural distortion, right greater than left, including possible solid abnormality seen in right lung apex."  End of tx PE exam: today 02/28/2022  Patient is doing well, no n/v/f/c, eating normally, no concerning weight loss. She reports that the abdominal pain she had reported at previous visits has resolved since she stopped her TB meds. She does report a persistent, mild cough that comes and goes.   Physical Exam Constitutional:      General: She is not in acute distress.    Appearance: Normal appearance.  HENT:     Head: Normocephalic.     Mouth/Throat:     Mouth: Mucous membranes are moist.  Eyes:     Extraocular Movements: Extraocular movements intact.     Conjunctiva/sclera: Conjunctivae normal.     Pupils: Pupils are equal, round, and reactive to light.  Cardiovascular:     Rate and Rhythm: Normal rate and regular rhythm.     Pulses:  Normal pulses.  Pulmonary:     Effort: Pulmonary effort is normal. No respiratory distress.     Breath sounds: Normal breath sounds. No wheezing, rhonchi or rales.  Abdominal:     General: Bowel sounds are normal. There is no distension.     Palpations: Abdomen is soft. There is no mass.     Tenderness: There is no abdominal tenderness. There is no guarding.  Musculoskeletal:        General: No swelling. Normal range of motion.     Cervical back: Normal range of motion and neck supple.     Right lower leg: No edema.     Left lower leg: No edema.  Skin:    General: Skin is warm.     Coloration: Skin is not jaundiced.     Findings: No bruising.  Neurological:     General: No focal deficit present.     Mental Status: She is alert and oriented to person, place, and time.     Cranial Nerves: No cranial nerve deficit.     Motor: No weakness.     Gait: Gait normal.  Psychiatric:        Mood and Affect: Mood normal.        Thought Content: Thought content normal.    Showed patient most recent CXR image and explained that her new baseline CXR is abnormal but disease free. The abnormalities seen are now chronic/long term abnormalities, essentially scarring as a result of her previous active TB disease, primarily in the bilateral upper lobes, right more than left.  She still has joint pain which is most likely arthritis,  but she is doing very well over all and very happy to be done with her TB treatment.  Counseled patient that she needs to establish a relationship with a PCP to address any mental health or physical health concerns, specifically her chronic joint pain and persistent cough. Provided patient with a list local primary care providers that accept patients without insurance and/or low income and accept payment on a sliding scale or even at no cost depending on their financial situation. Reassured patient that most of these clinics have spanish interpreters on site, or can use a phone  language line for interpreting. Expressed understanding that it is a frustrating process to make phone calls and fill out forms, but having a provider who can address important health concerns is essential and worth the time investment.    Explained to patient that she will always be positive on PPD skin test and blood test for exposure to TB. If patient is asked by employer, school, or other institution to take a TB test, patient should supply proof of treatment completion and/or obtain a TB Screening at the health department or at patient's PCP. This information was also provided in writing in the patient's completion letter which was given to the patient today along with a TB treatment completion card.   All completion letters and completion card were sent for scanning into Epic.  Patient was advised to contact [facility]/TB control phone for any concerns that may arise in the future.  Jennye Moccasin, MD

## 2022-03-01 ENCOUNTER — Encounter: Payer: Self-pay | Admitting: Surgery

## 2022-03-01 LAB — HEPATIC FUNCTION PANEL
ALT: 10 IU/L (ref 0–32)
AST: 24 IU/L (ref 0–40)
Albumin: 4.5 g/dL (ref 3.8–4.9)
Alkaline Phosphatase: 81 IU/L (ref 44–121)
Bilirubin Total: 0.3 mg/dL (ref 0.0–1.2)
Bilirubin, Direct: <0.1 mg/dL (ref 0.00–0.40)
Total Protein: 8 g/dL (ref 6.0–8.5)

## 2022-10-19 IMAGING — CR DG CHEST 2V
2 series · 2 of 2 positions shown · non-contrast
Comparison: None.

CLINICAL DATA: Abnormal lung sounds. On treatment for tuberculosis.

EXAM:
CHEST - 2 VIEW

[chest pa]
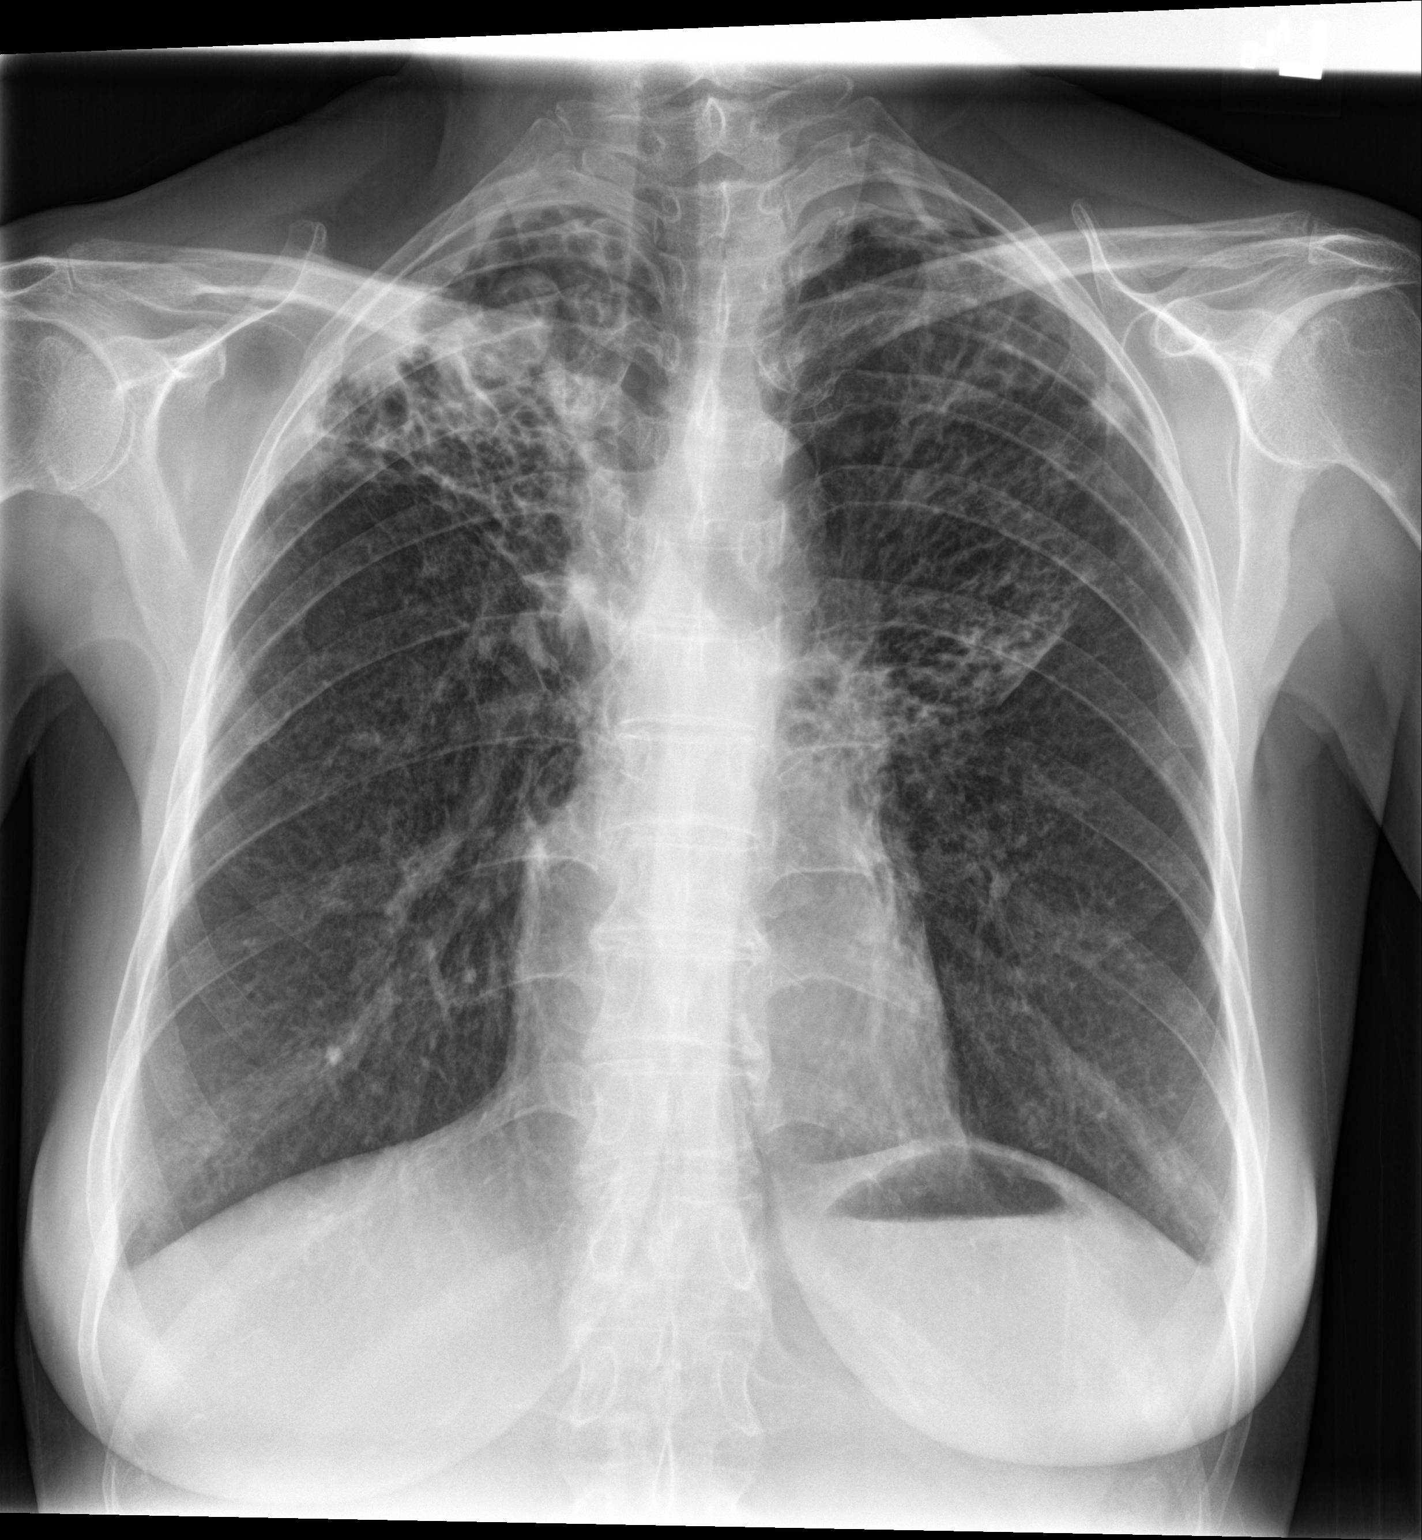

[chest lat]
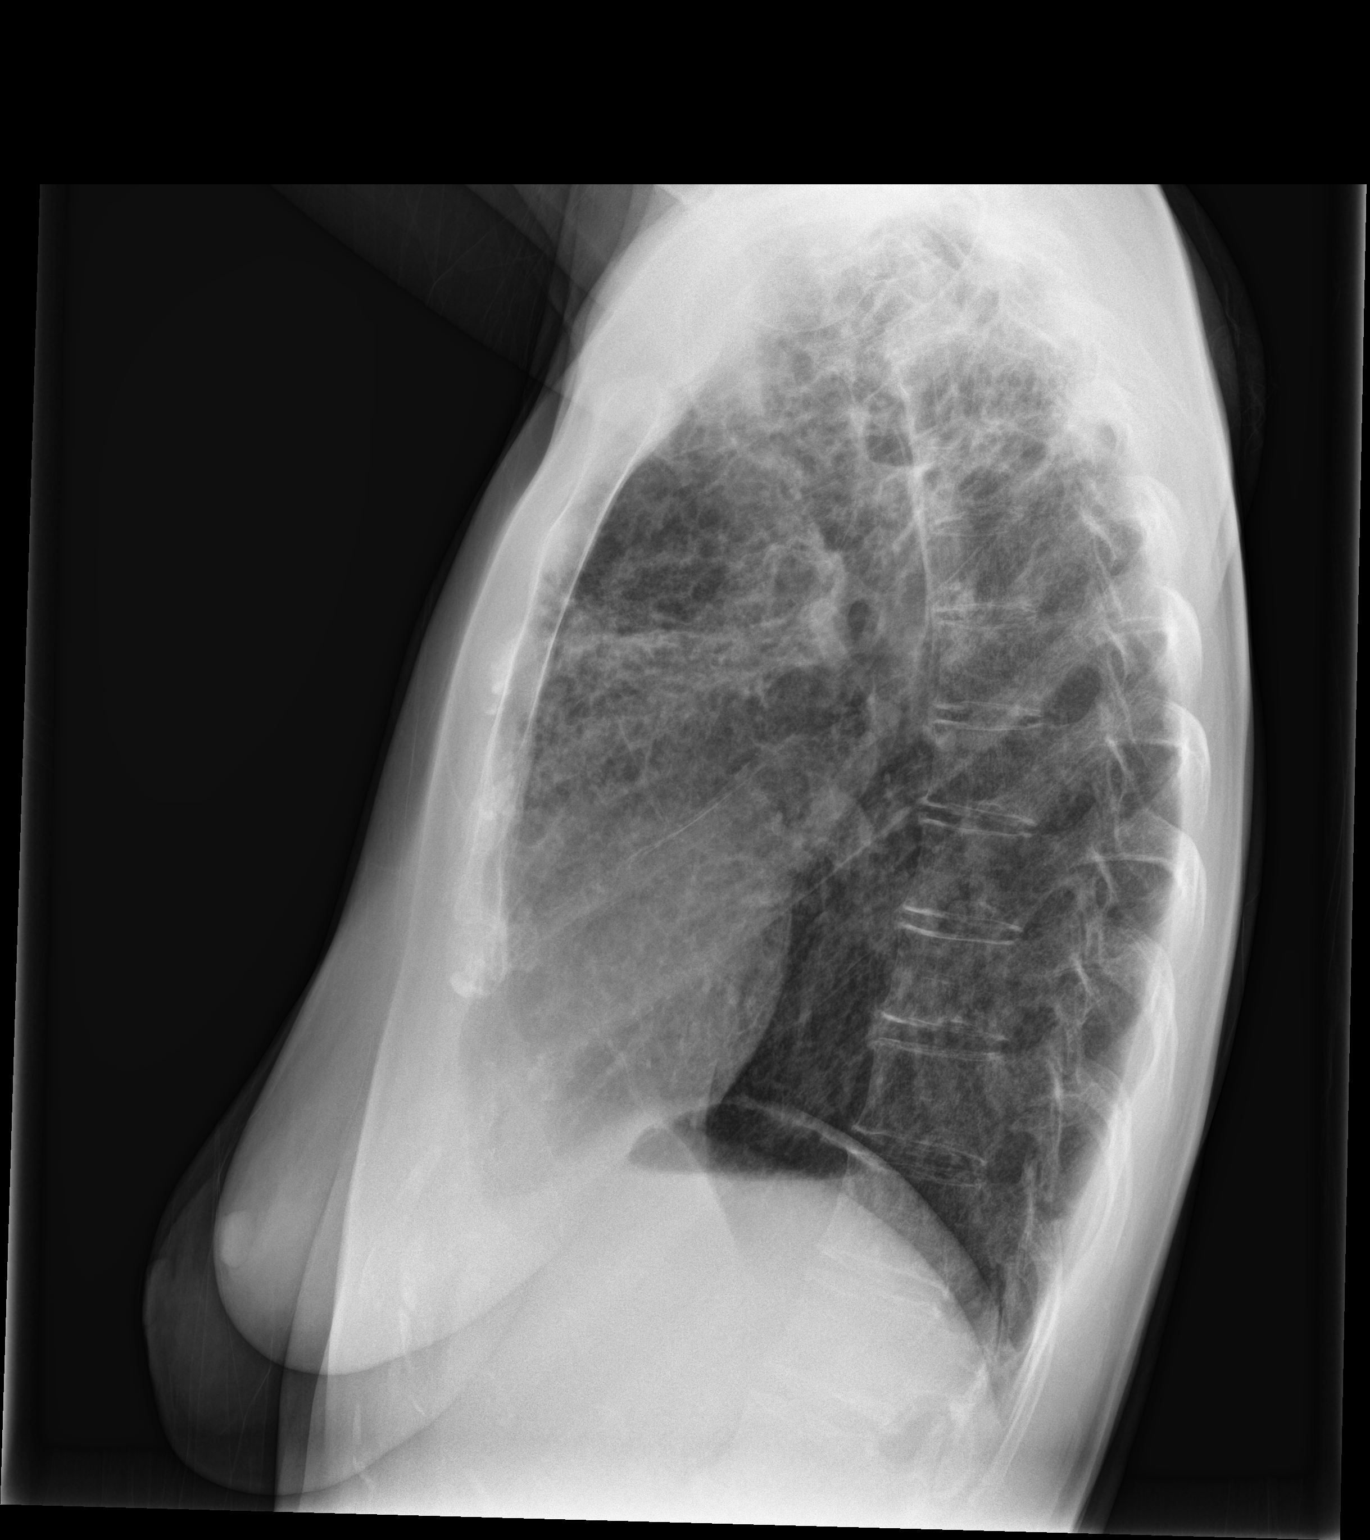

[2 of 2 positions shown; findings below may reference images not displayed]

FINDINGS: Two views of the chest demonstrate architectural distortion in the
upper lungs, right side greater than left. Evidence for cystic
changes or bronchiectasis in the upper lungs. Pleural-based
densities along the lung apices, right side greater than left.
Questionable 2.0 cm round nodular density near the right lung apex.
Heart size is normal. No large pleural effusions.
IMPRESSION: Architectural distortion in the upper lungs, right side greater than
left. Scattered pleural and parenchymal opacities in the upper lungs
that could represent acute on chronic disease, right side greater
than left. In addition, there is an indeterminate 2.0 cm nodular
opacity in the right upper lung. Recommend continued follow-up or
further characterization with chest CT.

## 2023-06-21 IMAGING — CR DG CHEST 1V
1 series · 1 of 1 positions shown · non-contrast
Comparison: June 11, 2021.

CLINICAL DATA: History of tuberculosis.

EXAM:
CHEST  1 VIEW

[chest pa]
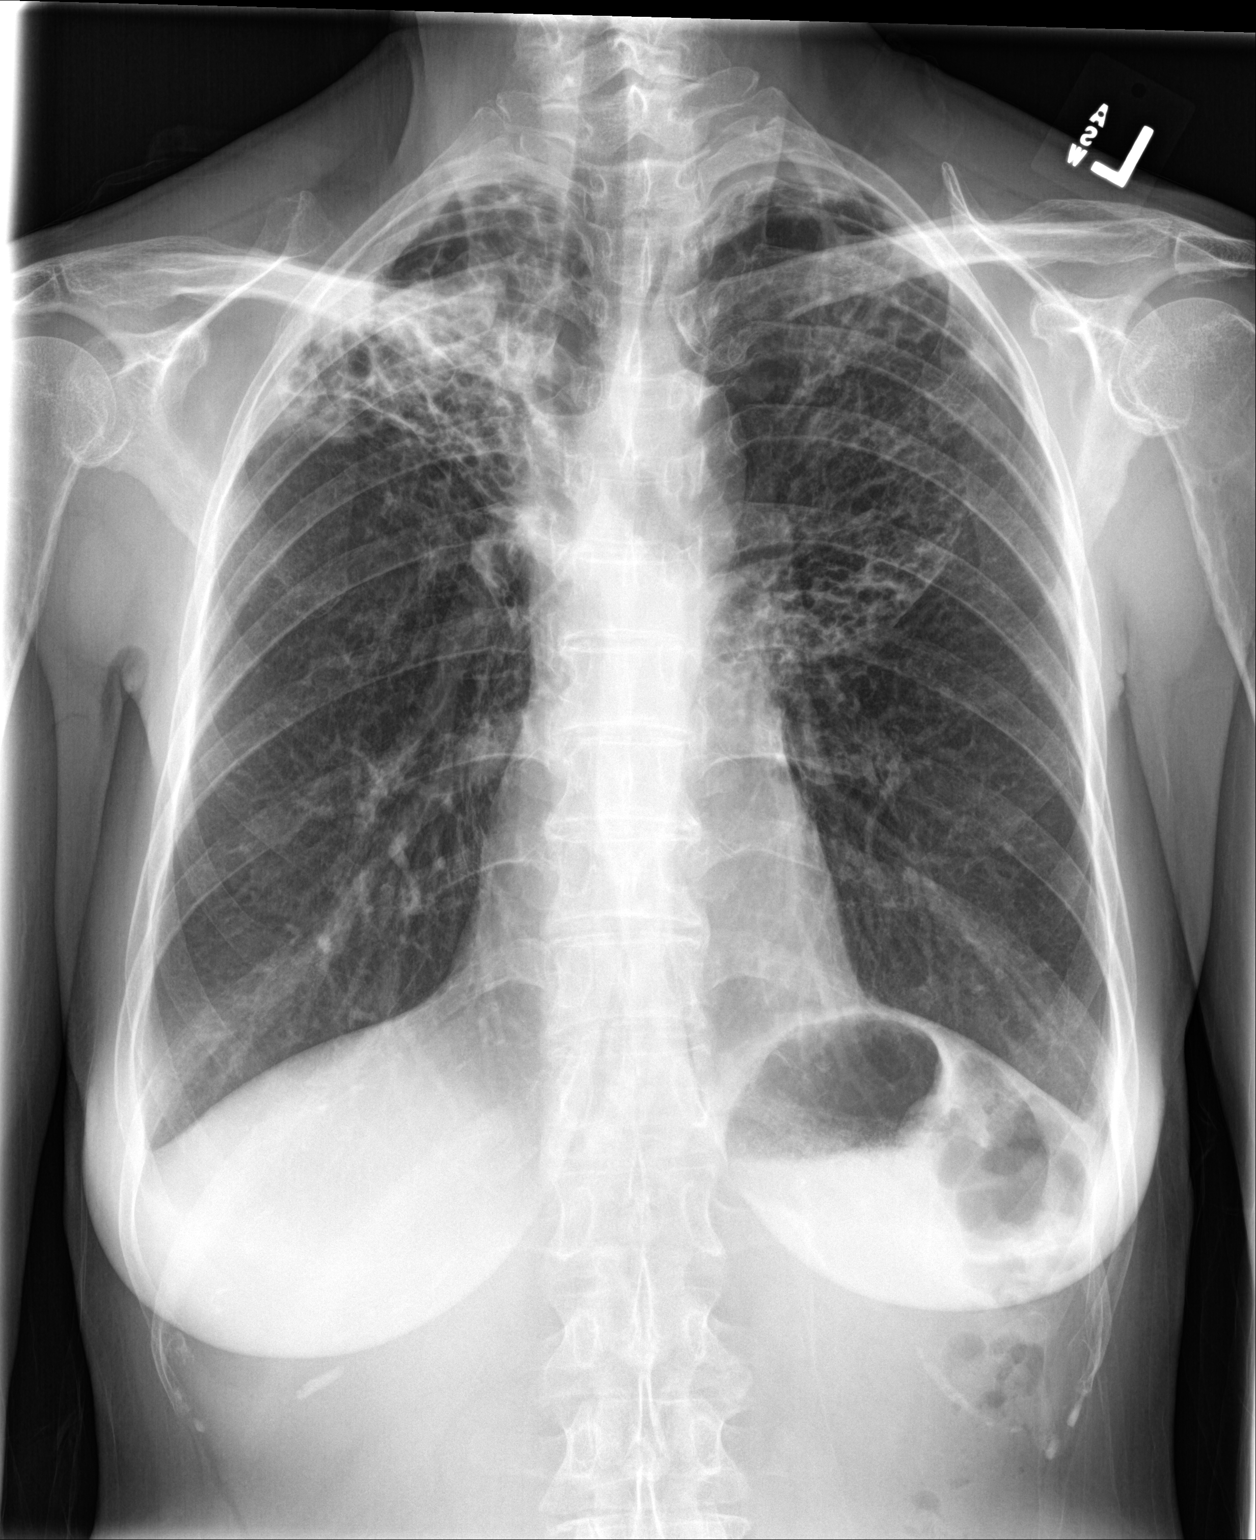

[1 of 1 positions shown; findings below may reference images not displayed]

FINDINGS: The heart size and mediastinal contours are within normal limits.
Stable bilateral upper lobe opacities are noted with associated
architectural distortion, right greater than left, including
possible solid abnormality seen in right lung apex. Biapical pleural
thickening is again noted. No acute findings are noted. The
visualized skeletal structures are unremarkable.
IMPRESSION: Stable chronic findings seen in the upper lobes bilaterally, right
greater than left. No significant changes noted compared to prior
exam.

## 2023-08-14 ENCOUNTER — Ambulatory Visit
Admission: RE | Admit: 2023-08-14 | Discharge: 2023-08-14 | Disposition: A | Discharge status: Home / Self Care | Payer: Self-pay | Source: Ambulatory Visit | Attending: Family Medicine | Admitting: Family Medicine

## 2023-08-14 ENCOUNTER — Telehealth: Payer: Self-pay

## 2023-08-14 ENCOUNTER — Other Ambulatory Visit: Payer: Self-pay | Admitting: Family Medicine

## 2023-08-14 ENCOUNTER — Ambulatory Visit
Admission: RE | Admit: 2023-08-14 | Discharge: 2023-08-14 | Disposition: A | Discharge status: Home / Self Care | Payer: Self-pay | Attending: Family Medicine | Admitting: Family Medicine

## 2023-08-14 DIAGNOSIS — Z8611 Personal history of tuberculosis: Secondary | ICD-10-CM

## 2023-08-14 NOTE — Telephone Encounter (Signed)
Notification of a positive QFT received from [facility] at [facility].  Phone call to patient, patient's daughter answered call stating her mom had TB test done due to frequent cough with bloody sputum x 1 month.  Message left for patient to contact [place] [facility] at [phone]. Lavinia Sharps, RN   Phone call from patient stating she had CXR done on 08/14/2023. Report pending. Patient reports feeling well, other than frequent productive cough. Patient will states she will provide name of the facility where radiology was done. I will contact patient with further guidance. Lavinia Sharps, RN

## 2023-09-03 ENCOUNTER — Telehealth: Payer: Self-pay

## 2023-09-03 NOTE — Telephone Encounter (Signed)
Phone call received from [facility] provider requesting follow-up status of positive TB IGRA test.  Phone call returned, message left with clerical staff requesting provider to contact TB clinic nurse.   On 08/20/2023 CXR report was reviewed by Dr. Levonne Hubert, sputum collection recommended. Several phone call attempts to patient home and mobile phone number. No response from patient. Message left with patient's daughter requesting her to contact [place] [facility]. Lavinia Sharps, RN

## 2023-09-04 ENCOUNTER — Telehealth: Payer: Self-pay

## 2023-09-04 NOTE — Telephone Encounter (Signed)
Response call received from [facility], [place]. I spoke with Herbert Seta about the need of sputum collection. She will try to contact patient to request her to contact [place] [facility] clinic nurse for follow-up. Lavinia Sharps, RN

## 2023-09-08 ENCOUNTER — Ambulatory Visit: Payer: Self-pay

## 2023-09-09 ENCOUNTER — Ambulatory Visit: Payer: Self-pay

## 2023-09-09 ENCOUNTER — Telehealth: Payer: Self-pay

## 2023-09-09 VITALS — Wt 94.0 lb

## 2023-09-09 DIAGNOSIS — R058 Other specified cough: Secondary | ICD-10-CM

## 2023-09-09 NOTE — Progress Notes (Signed)
Home visit today for sputum collection. Patient reported experiencing productive cough for several months. 1-2 episodes of hemoptysis, worse in the mornings and late evening.   Patient advised to collect 3 sputum early morning for the next 3 days.   Home visit for follow-up and sputum collection 09/14/2023. Patient states understanding and agrees with plan. Lavinia Sharps, RN

## 2023-09-09 NOTE — Telephone Encounter (Signed)
Home visit scheduled 09/08/2023, unable to contact patient to complete visit. Phone call to patient, stressed importance to collect sputum as soon as possible to r/o active TB disease. Patient states understanding and agrees, home visit rescheduled for today between 1:00 pm. To 2:00 pm.    Lavinia Sharps, RN

## 2023-09-10 NOTE — Progress Notes (Signed)
 Visit notes reviewed Richmond Campbell, RN

## 2023-09-28 ENCOUNTER — Telehealth: Payer: Self-pay

## 2023-09-28 NOTE — Telephone Encounter (Signed)
Several intents to collect sputum, but unsuccessful, consulted with Dr. Wyvonnia Lora, referral to pulmonologist recommended.  Phone call to [facility], I spoke with clerical staff [place], left a message for provider requesting a referral to pulmonologist.  Phone call to patient, message left with patient's daughter asking patient to contact [place] [facility] nurse at [phone]. Lavinia Sharps, RN

## 2023-11-18 ENCOUNTER — Telehealth: Payer: Self-pay

## 2023-11-18 NOTE — Telephone Encounter (Signed)
Phone call to patient to follow-up prior recommendation for pulmonology evaluation. Patient reports just having occasional dry cough, denies fever, productive cough, chest pain, loss of appetite or other symptoms. She states not being evaluated by pulmonology yet. Encouraged to follow-up as recommended. Patient states understanding and agrees. Lavinia Sharps, RN
